# Patient Record
Sex: Female | Born: 1935 | Race: White | Hispanic: No | Smoking: Former smoker
Health system: Southern US, Community
[De-identification: ages and names within clinical notes are randomized; demographics above are authoritative.]

## PROBLEM LIST (undated history)

## (undated) DIAGNOSIS — F0390 Unspecified dementia without behavioral disturbance: Secondary | ICD-10-CM

## (undated) DIAGNOSIS — C801 Malignant (primary) neoplasm, unspecified: Secondary | ICD-10-CM

## (undated) DIAGNOSIS — J449 Chronic obstructive pulmonary disease, unspecified: Secondary | ICD-10-CM

## (undated) DIAGNOSIS — I4891 Unspecified atrial fibrillation: Secondary | ICD-10-CM

## (undated) DIAGNOSIS — C50919 Malignant neoplasm of unspecified site of unspecified female breast: Secondary | ICD-10-CM

## (undated) DIAGNOSIS — I509 Heart failure, unspecified: Secondary | ICD-10-CM

## (undated) DIAGNOSIS — I10 Essential (primary) hypertension: Secondary | ICD-10-CM

## (undated) HISTORY — PX: MASTECTOMY: SHX3

## (undated) HISTORY — PX: BACK SURGERY: SHX140

## (undated) HISTORY — PX: ABDOMINAL HYSTERECTOMY: SHX81

---

## 2005-05-06 ENCOUNTER — Ambulatory Visit: Payer: Self-pay | Admitting: Internal Medicine

## 2006-05-07 ENCOUNTER — Ambulatory Visit: Payer: Self-pay | Admitting: Internal Medicine

## 2007-03-29 ENCOUNTER — Emergency Department: Payer: Self-pay | Admitting: Emergency Medicine

## 2007-05-12 ENCOUNTER — Ambulatory Visit: Payer: Self-pay | Admitting: Internal Medicine

## 2007-05-18 ENCOUNTER — Ambulatory Visit: Payer: Self-pay | Admitting: Internal Medicine

## 2007-08-08 ENCOUNTER — Ambulatory Visit: Payer: Self-pay | Admitting: Internal Medicine

## 2007-08-09 ENCOUNTER — Emergency Department: Payer: Self-pay

## 2007-08-26 ENCOUNTER — Emergency Department: Payer: Self-pay | Admitting: Emergency Medicine

## 2008-05-16 ENCOUNTER — Ambulatory Visit: Payer: Self-pay | Admitting: Internal Medicine

## 2009-04-17 ENCOUNTER — Emergency Department: Payer: Self-pay | Admitting: Emergency Medicine

## 2009-05-17 ENCOUNTER — Ambulatory Visit: Payer: Self-pay | Admitting: Internal Medicine

## 2010-05-20 ENCOUNTER — Ambulatory Visit: Payer: Self-pay | Admitting: Internal Medicine

## 2011-02-25 ENCOUNTER — Ambulatory Visit: Payer: Self-pay | Admitting: Otolaryngology

## 2011-03-27 ENCOUNTER — Ambulatory Visit: Payer: Self-pay | Admitting: Specialist

## 2011-05-22 ENCOUNTER — Ambulatory Visit: Payer: Self-pay | Admitting: Internal Medicine

## 2011-07-29 ENCOUNTER — Ambulatory Visit: Payer: Self-pay | Admitting: Specialist

## 2012-03-11 ENCOUNTER — Ambulatory Visit: Payer: Self-pay | Admitting: Otolaryngology

## 2012-05-25 ENCOUNTER — Ambulatory Visit: Payer: Self-pay | Admitting: Internal Medicine

## 2012-06-12 ENCOUNTER — Emergency Department: Payer: Self-pay | Admitting: Internal Medicine

## 2012-06-12 LAB — COMPREHENSIVE METABOLIC PANEL
Albumin: 3.6 g/dL (ref 3.4–5.0)
Alkaline Phosphatase: 83 U/L (ref 50–136)
Bilirubin,Total: 0.7 mg/dL (ref 0.2–1.0)
Chloride: 104 mmol/L (ref 98–107)
Co2: 32 mmol/L (ref 21–32)
Creatinine: 0.99 mg/dL (ref 0.60–1.30)
EGFR (Non-African Amer.): 51 — ABNORMAL LOW
Glucose: 96 mg/dL (ref 65–99)
Osmolality: 281 (ref 275–301)
Sodium: 140 mmol/L (ref 136–145)

## 2012-06-12 LAB — CBC
HCT: 39.3 % (ref 35.0–47.0)
MCH: 29.8 pg (ref 26.0–34.0)
MCHC: 33 g/dL (ref 32.0–36.0)
MCV: 90 fL (ref 80–100)
Platelet: 171 10*3/uL (ref 150–440)
RBC: 4.36 10*6/uL (ref 3.80–5.20)
WBC: 3.8 10*3/uL (ref 3.6–11.0)

## 2013-02-07 ENCOUNTER — Ambulatory Visit: Payer: Self-pay | Admitting: Ophthalmology

## 2013-02-07 LAB — PROTIME-INR: Prothrombin Time: 37.4 secs — ABNORMAL HIGH (ref 11.5–14.7)

## 2013-02-07 LAB — POTASSIUM: Potassium: 4 mmol/L (ref 3.5–5.1)

## 2013-02-16 ENCOUNTER — Ambulatory Visit: Payer: Self-pay | Admitting: Ophthalmology

## 2013-07-07 ENCOUNTER — Ambulatory Visit: Payer: Self-pay | Admitting: Internal Medicine

## 2013-11-21 ENCOUNTER — Emergency Department: Payer: Self-pay | Admitting: Emergency Medicine

## 2013-11-21 LAB — PROTIME-INR: Prothrombin Time: 31.5 secs — ABNORMAL HIGH (ref 11.5–14.7)

## 2013-11-21 LAB — CBC WITH DIFFERENTIAL/PLATELET
Basophil %: 0.9 %
Eosinophil #: 0.2 10*3/uL (ref 0.0–0.7)
HCT: 37.1 % (ref 35.0–47.0)
HGB: 12.5 g/dL (ref 12.0–16.0)
Lymphocyte #: 1.1 10*3/uL (ref 1.0–3.6)
Lymphocyte %: 20.7 %
MCH: 30.1 pg (ref 26.0–34.0)
MCHC: 33.6 g/dL (ref 32.0–36.0)
Monocyte #: 0.5 x10 3/mm (ref 0.2–0.9)
Monocyte %: 10 %
Neutrophil %: 65.2 %
WBC: 5.2 10*3/uL (ref 3.6–11.0)

## 2014-07-10 ENCOUNTER — Ambulatory Visit: Payer: Self-pay | Admitting: Internal Medicine

## 2015-03-16 NOTE — Op Note (Signed)
PATIENT NAME:  Haley Paul, Haley Paul MR#:  [id number] DATE OF BIRTH:  [birth year]  DATE OF SURGERY:  02/16/2013  PROCEDURES PERFORMED: 1.  Pars plana vitrectomy of the left eye.  2.  Internal limiting membrane peel of the left eye.   PREOPERATIVE DIAGNOSES: 1.  Epiretinal membrane of the left eye.  2.  Retinal edema of the left eye.   POSTOPERATIVE DIAGNOSES: 1.  Epiretinal membrane of the left eye.  2.  Retinal edema of the left eye.   ESTIMATED BLOOD LOSS: Less than 1 mL.   PRIMARY SURGEON: Garlan Fair, M.D.   ANESTHESIA: Retrobulbar block of the left eye, with monitored anesthesia care.   COMPLICATIONS: None.   INDICATIONS FOR PROCEDURE: This patient presented to my office with slowly decreasing vision in the left eye. Examination revealed a visual acuity of 20/50-, associated with an epiretinal membrane and retinal edema. Risks, benefits, and alternatives of the above procedure were discussed and the patient wished to proceed.   DETAILS: After informed consent was obtained the patient was brought in the operative suite at [facility]. The patient was placed in supine position and was given a small dose of Alfenta, and a retrobulbar block was performed in the left eye by the primary surgeon without any complications. The left eye was prepped and draped in sterile manner. After a lid speculum was inserted a 25-gauge trocar was placed inferotemporally through displaced conjunctiva in an oblique fashion 3 mm beyond the limbus. The infusion cannula was turned on and inserted through the trocar and secured into position with Steri-Strips. Two more trocars were placed in a similar fashion superotemporally and superonasally. The vitreous cutter and light pipe were introduced in the eye and a core vitrectomy was performed. Vitreous face was confirmed as already elevated, and the vitreous face was removed. Peripheral vitreous was trimmed for 360 degrees. Indocyanine green  was injected onto the posterior pole and was removed within 30 seconds. A Tano brush was utilized to create an internal limiting membrane flap inferior to the fovea and near the arcade. Intraocular forceps were used to peel the internal limiting membrane along with the epiretinal membrane, along with the epiretinal membrane, for 360 degrees around the fovea for a total diameter of 2 disk-diameters. No signs of any macular hole could be identified. A scleral depressed exam was performed for 360 degrees. No signs of any breaks, tears, or retinal detachment could be identified. A partial air-fluid exchange was performed. The trocars were removed and the wounds were noted to be airtight. Pressure in the eye was confirmed to be approximately 15 mmHg. Five milligrams of dexamethasone was given into the inferior fornix and the lid speculum was removed. The eye was cleaned and TobraDex was placed in the eye. A patch and shield were placed over the eye and the patient was taken to postanesthesia care with instructions to remain head-up.     ____________________________ Teresa Pelton. Starling Manns, MD mfa:[id number] D: 02/16/2013 07:59:00 ET T: 02/16/2013 08:09:29 ET JOB#: [id number]  cc: Teresa Pelton. Starling Manns, MD, <Dictator> Coralee Rud MD ELECTRONICALLY SIGNED 02/23/2013 9:00

## 2016-02-14 ENCOUNTER — Encounter: Payer: Self-pay | Admitting: Intensive Care

## 2016-02-14 ENCOUNTER — Emergency Department
Admission: EM | Admit: 2016-02-14 | Discharge: 2016-02-14 | Disposition: A | Discharge status: Home / Self Care | Payer: Medicare Other | Attending: Emergency Medicine | Admitting: Emergency Medicine

## 2016-02-14 ENCOUNTER — Emergency Department: Payer: Medicare Other

## 2016-02-14 DIAGNOSIS — I10 Essential (primary) hypertension: Secondary | ICD-10-CM | POA: Insufficient documentation

## 2016-02-14 DIAGNOSIS — J159 Unspecified bacterial pneumonia: Secondary | ICD-10-CM | POA: Insufficient documentation

## 2016-02-14 DIAGNOSIS — Z79899 Other long term (current) drug therapy: Secondary | ICD-10-CM | POA: Diagnosis not present

## 2016-02-14 DIAGNOSIS — F0390 Unspecified dementia without behavioral disturbance: Secondary | ICD-10-CM | POA: Diagnosis not present

## 2016-02-14 DIAGNOSIS — J189 Pneumonia, unspecified organism: Secondary | ICD-10-CM

## 2016-02-14 DIAGNOSIS — J441 Chronic obstructive pulmonary disease with (acute) exacerbation: Secondary | ICD-10-CM | POA: Insufficient documentation

## 2016-02-14 DIAGNOSIS — R0602 Shortness of breath: Secondary | ICD-10-CM | POA: Diagnosis present

## 2016-02-14 DIAGNOSIS — J90 Pleural effusion, not elsewhere classified: Secondary | ICD-10-CM | POA: Diagnosis not present

## 2016-02-14 DIAGNOSIS — Z7901 Long term (current) use of anticoagulants: Secondary | ICD-10-CM | POA: Diagnosis not present

## 2016-02-14 DIAGNOSIS — R0902 Hypoxemia: Secondary | ICD-10-CM | POA: Diagnosis not present

## 2016-02-14 HISTORY — DX: Malignant (primary) neoplasm, unspecified: C80.1

## 2016-02-14 HISTORY — DX: Unspecified dementia, unspecified severity, without behavioral disturbance, psychotic disturbance, mood disturbance, and anxiety: F03.90

## 2016-02-14 HISTORY — DX: Malignant neoplasm of unspecified site of unspecified female breast: C50.919

## 2016-02-14 HISTORY — DX: Chronic obstructive pulmonary disease, unspecified: J44.9

## 2016-02-14 HISTORY — DX: Heart failure, unspecified: I50.9

## 2016-02-14 HISTORY — DX: Unspecified atrial fibrillation: I48.91

## 2016-02-14 HISTORY — DX: Essential (primary) hypertension: I10

## 2016-02-14 LAB — COMPREHENSIVE METABOLIC PANEL
ALT: 20 U/L (ref 14–54)
AST: 28 U/L (ref 15–41)
Albumin: 3.4 g/dL — ABNORMAL LOW (ref 3.5–5.0)
Alkaline Phosphatase: 163 U/L — ABNORMAL HIGH (ref 38–126)
Anion gap: 5 (ref 5–15)
BUN: 25 mg/dL — ABNORMAL HIGH (ref 6–20)
CALCIUM: 10 mg/dL (ref 8.9–10.3)
CHLORIDE: 103 mmol/L (ref 101–111)
CO2: 29 mmol/L (ref 22–32)
CREATININE: 0.94 mg/dL (ref 0.44–1.00)
GFR calc non Af Amer: 51 mL/min — ABNORMAL LOW (ref >60)
GFR, EST AFRICAN AMERICAN: 59 mL/min — AB (ref >60)
Glucose, Bld: 113 mg/dL — ABNORMAL HIGH (ref 65–99)
POTASSIUM: 3.9 mmol/L (ref 3.5–5.1)
SODIUM: 137 mmol/L (ref 135–145)
TOTAL PROTEIN: 8.8 g/dL — AB (ref 6.5–8.1)
Total Bilirubin: 1.2 mg/dL (ref 0.3–1.2)

## 2016-02-14 LAB — URINALYSIS COMPLETE WITH MICROSCOPIC (ARMC ONLY)
Bacteria, UA: NONE SEEN
Bilirubin Urine: NEGATIVE
GLUCOSE, UA: NEGATIVE mg/dL
Hgb urine dipstick: NEGATIVE
Ketones, ur: NEGATIVE mg/dL
Leukocytes, UA: NEGATIVE
Nitrite: NEGATIVE
Protein, ur: NEGATIVE mg/dL
Specific Gravity, Urine: 1.012 (ref 1.005–1.030)
pH: 6 (ref 5.0–8.0)

## 2016-02-14 LAB — BLOOD GAS, ARTERIAL
ALLENS TEST (PASS/FAIL): POSITIVE — AB
Acid-Base Excess: 4 mmol/L — ABNORMAL HIGH (ref 0.0–3.0)
BICARBONATE: 29.2 meq/L — AB (ref 21.0–28.0)
FIO2: 21
O2 Saturation: 89.8 %
PATIENT TEMPERATURE: 37
pCO2 arterial: 45 mmHg (ref 32.0–48.0)
pH, Arterial: 7.42 (ref 7.350–7.450)
pO2, Arterial: 57 mmHg — ABNORMAL LOW (ref 83.0–108.0)

## 2016-02-14 LAB — TROPONIN I

## 2016-02-14 LAB — CBC
HCT: 33.2 % — ABNORMAL LOW (ref 35.0–47.0)
Hemoglobin: 10.8 g/dL — ABNORMAL LOW (ref 12.0–16.0)
MCH: 27 pg (ref 26.0–34.0)
MCHC: 32.6 g/dL (ref 32.0–36.0)
MCV: 82.8 fL (ref 80.0–100.0)
PLATELETS: 91 10*3/uL — AB (ref 150–440)
RBC: 4.01 MIL/uL (ref 3.80–5.20)
RDW: 17.1 % — AB (ref 11.5–14.5)
WBC: 8.8 10*3/uL (ref 3.6–11.0)

## 2016-02-14 LAB — PROTIME-INR
INR: 4.64 — AB
PROTHROMBIN TIME: 42.5 s — AB (ref 11.4–15.0)

## 2016-02-14 LAB — BRAIN NATRIURETIC PEPTIDE: B NATRIURETIC PEPTIDE 5: 881 pg/mL — AB (ref 0.0–100.0)

## 2016-02-14 MED ORDER — DEXTROSE 5 % IV SOLN
1.0000 g | Freq: Once | INTRAVENOUS | Status: AC
  Administered 2016-02-14: 1 g via INTRAVENOUS
  Filled 2016-02-14: qty 10

## 2016-02-14 MED ORDER — DEXTROSE 5 % IV SOLN
500.0000 mg | Freq: Once | INTRAVENOUS | Status: AC
  Administered 2016-02-14: 500 mg via INTRAVENOUS
  Filled 2016-02-14: qty 500

## 2016-02-14 MED ORDER — AMOXICILLIN-POT CLAVULANATE 875-125 MG PO TABS: 1.0000 | ORAL_TABLET | Freq: Two times a day (BID) | ORAL | Status: DC

## 2016-02-14 MED ORDER — FUROSEMIDE 20 MG PO TABS: 20.0000 mg | ORAL_TABLET | Freq: Every day | ORAL | Status: DC

## 2016-02-14 NOTE — Consult Note (Signed)
[facility] [place] NAME: Haley Paul    MR#:  [id number]  DATE OF BIRTH:  1920/11/22  DATE OF ADMISSION:  02/14/2016  PRIMARY CARE PHYSICIAN: Lisette Grinder MD  REQUESTING/REFERRING PHYSICIAN: Marta Antu MD  CHIEF COMPLAINT:   Chief Complaint  Patient presents with  . Shortness of Breath    HISTORY OF PRESENT ILLNESS:  Haley Paul  is a 82 y.o. female with a known history of COPD, breast cancer, CHF and atrial fibrillation is seen in consultation for shortness of breath. Patient was given a breathing treatment on the way over in the and per family, was much improved on arrival to the ER. The daughter stated that she's been having cough and cold symptoms for the past couple of days. Because of her dementia and cannot give [place] any other significant history. Patient also has difficulty hearing.  She is saturating 95-96% on room air, and her vitals are very stable.  She does not look toxic.  Her white count is normal. PAST MEDICAL HISTORY:   Past Medical History  Diagnosis Date  . COPD (chronic obstructive pulmonary disease) ([place])   . Cancer ([place])   . Breast cancer ([place])   . CHF (congestive heart failure) ([place])   . Atrial fibrillation ([place])   . Hypertension   . Dementia    PAST SURGICAL HISTOIRY:   Past Surgical History  Procedure Laterality Date  . Abdominal hysterectomy    . Back surgery    . Mastectomy Left     SOCIAL HISTORY:   Social History  Substance Use Topics  . Smoking status: Former Research scientist (life sciences)  . Smokeless tobacco: Never Used  . Alcohol Use: No    FAMILY HISTORY:  History reviewed. No pertinent family history. Father died of cancer in 15s DRUG ALLERGIES:  No Known Allergies  REVIEW OF SYSTEMS:  CONSTITUTIONAL: No fever, fatigue or weakness.  EYES: No blurred or double vision.  EARS, NOSE, AND THROAT: No tinnitus or ear pain.  RESPIRATORY: + for cough, shortness of breath, No wheezing or  hemoptysis.  CARDIOVASCULAR: No chest pain, orthopnea, edema.  GASTROINTESTINAL: No nausea, vomiting, diarrhea or abdominal pain.  GENITOURINARY: No dysuria, hematuria.  ENDOCRINE: No polyuria, nocturia,  HEMATOLOGY: No anemia, easy bruising or bleeding SKIN: No rash or lesion. MUSCULOSKELETAL: No joint pain or arthritis. + LE edema   NEUROLOGIC: No tingling, numbness, weakness.  PSYCHIATRY: No anxiety or depression.   MEDICATIONS AT HOME:   Prior to Admission medications   Medication Sig Start Date End Date Taking? Authorizing Provider  Calcium Carbonate-Vitamin D 600-400 MG-UNIT tablet Take 1 tablet by mouth 2 (two) times daily. 05/22/14  Yes Historical Provider, MD  Cyanocobalamin (RA VITAMIN B-12 TR) 1000 MCG TBCR Take 1 tablet by mouth daily.   Yes Historical Provider, MD  diltiazem (DILACOR XR) 120 MG 24 hr capsule Take 1 capsule by mouth at bedtime.  07/31/15  Yes Historical Provider, MD  docusate sodium (COLACE) 100 MG capsule Take 100 mg by mouth 2 (two) times daily.   Yes Historical Provider, MD  levothyroxine (SYNTHROID, LEVOTHROID) 50 MCG tablet Take 1 tablet by mouth at bedtime.  07/31/15  Yes Historical Provider, MD  metoprolol tartrate (LOPRESSOR) 25 MG tablet Take 1 tablet by mouth 2 (two) times daily. 07/31/15  Yes Historical Provider, MD  risperiDONE (RISPERDAL) 0.5 MG tablet Take 1 tablet by mouth 2 (two) times daily.  01/14/16 07/12/16 Yes Historical Provider, MD  simvastatin (ZOCOR) 40  MG tablet Take 1 tablet by mouth at bedtime.  07/31/15  Yes Historical Provider, MD  warfarin (COUMADIN) 2 MG tablet Take 1 tablet by mouth at bedtime. 01/22/16 01/21/17 Yes Historical Provider, MD  amoxicillin-clavulanate (AUGMENTIN) 875-125 MG tablet Take 1 tablet by mouth 2 (two) times daily. 02/14/16   Max Sane, MD  furosemide (LASIX) 20 MG tablet Take 1 tablet (20 mg total) by mouth daily. 20 mg po bid for next 3 days and then start 20 mg daily 02/14/16   Max Sane, MD      VITAL SIGNS:   Blood pressure 133/84, pulse 35, temperature 98.2 F (36.8 C), temperature source Oral, resp. rate 17, weight 63.504 kg (140 lb), SpO2 94 %. PHYSICAL EXAMINATION:  GENERAL:  82 y.o.-year-old patient lying in the bed with no acute distress.  EYES: Pupils equal, round, reactive to light and accommodation. No scleral icterus. Extraocular muscles intact.  HEENT: Head atraumatic, normocephalic. Oropharynx and nasopharynx clear.  NECK:  Supple, no jugular venous distention. No thyroid enlargement, no tenderness.  LUNGS: Normal breath sounds bilaterally, no wheezing, rales,rhonchi or crepitation. No use of accessory muscles of respiration.  CARDIOVASCULAR: S1, S2 normal. No murmurs, rubs, or gallops.  ABDOMEN: Soft, nontender, nondistended. Bowel sounds present. No organomegaly or mass.  EXTREMITIES: 1 + pedal edema, No cyanosis, or clubbing.  NEUROLOGIC: Cranial nerves II through XII are intact. Muscle strength 5/5 in all extremities. Sensation intact. Gait not checked.  PSYCHIATRIC: The patient is alert and oriented x 3.  Has significant difficulty hearing SKIN: No obvious rash, lesion, or ulcer.  LABORATORY PANEL:   CBC  Recent Labs Lab 02/14/16 1406  WBC 8.8  HGB 10.8*  HCT 33.2*  PLT 91*   ------------------------------------------------------------------------------------------------------------------  Chemistries   Recent Labs Lab 02/14/16 1406  NA 137  K 3.9  CL 103  CO2 29  GLUCOSE 113*  BUN 25*  CREATININE 0.94  CALCIUM 10.0  AST 28  ALT 20  ALKPHOS 163*  BILITOT 1.2   ------------------------------------------------------------------------------------------------------------------  Cardiac Enzymes  Recent Labs Lab 02/14/16 1406  TROPONINI <0.03   ------------------------------------------------------------------------------------------------------------------  RADIOLOGY:  Dg Chest 2 View  02/14/2016  CLINICAL DATA:  Altered mental status EXAM: CHEST  2  VIEW COMPARISON:  06/12/2012 FINDINGS: New dense right upper lobe consolidation. There is some airspace disease at the posterior right lung base heart is enlarged. Left upper lung zone is clear. Left pleural effusion is suspected. No pneumothorax. IMPRESSION: Right upper and lower lobe consolidation. Left pleural effusion. Followup PA and lateral chest X-ray is recommended in 3-4 weeks following trial of antibiotic therapy to ensure resolution and exclude underlying malignancy. Electronically Signed   By: Marybelle Killings M.D.   On: 02/14/2016 14:29    EKG:   Orders placed or performed during the hospital encounter of 02/14/16  . ED EKG  . ED EKG  . EKG 12-Lead  . EKG 12-Lead  . ED EKG  . ED EKG    IMPRESSION AND PLAN:  82 year old female seen in the emergency department in consultation for shortness of breath  * Pneumonia: Community-acquired, patient does not have any fever, no leukocytosis.  She does not look toxic.  I will recommend treating her as an outpatient with oral Augmentin for 5 days.  A chest x-ray and monitor her clinically.  * Lower extremity edema: With minimal left-sided pleural effusion, worrisome for worsening of her CHF, recommend increasing her Lasix to twice a day from once a day that she normally takes for  3 more days and repeat basic metabolic panel on coming Monday for further monitoring and adjustment in dosing  * Atrial fibrillation: She is on Coumadin.  Her INR is 4.6.  I recommend holding her Coumadin over the weekend and resume it on Monday after her pro time checked  * Hypothyroidism.  Continue Synthroid  Recommend follow-up with Dr. Lisette Grinder early next week  All the records are reviewed and case discussed with Consulting provider. Management plans discussed with the patient, family and they are in agreement.  CODE STATUS: DO NOT RESUSCITATE  TOTAL TIME TAKING CARE OF THIS PATIENT: 45 minutes.    [facility], Aliesha Dolata M.D on 02/14/2016 at 5:35 PM  Between 7am  to 6pm - Pager - [phone]  After 6pm go to [email or web address] - password [email or web address] [facility]  Office  [phone]  CC: Primary care Physician: Lisette Grinder MD  Note: This dictation was prepared with Dragon dictation along with smaller phrase technology. Any transcriptional errors that result from this process are unintentional.

## 2016-02-14 NOTE — ED Notes (Signed)
Patient arrived by EMS from home. Family called EMS for increased difficulty breathing the past two days. Patient has HX of COPD, CHF, A-fib. EMS states patient was Room Air 93-94. EMS states crackles/ Rhonchi heard in lungs. Patient also has bilateral swelling in lower extremities and family states she is on lasix.

## 2016-02-14 NOTE — ED Provider Notes (Addendum)
[facility] Emergency Department Provider Note  ____________________________________________  Time seen: Approximately 2:09 PM  I have reviewed the triage vital signs and the nursing notes.   HISTORY  Chief Complaint Shortness of Breath    HPI Haley Paul is a 82 y.o. female who has severe dementia, and family states that she had progressive worsening shortness of breath this morning. He stated when they called her she was in pretty significant distress from her breathing. Patient has had history of COPD and CHF, and patient was given a breathing treatment on the way over in the [place] and per family, was much improved on arrival to the ER. Patient was still having some mild shortness of breath but not nearly as bad. The daughter stated that she's been having cough and cold symptoms for the past couple of days. Patient because of her dementia and cannot give [place] any other significant history. Patient does not appear to be in any pain.   Past Medical History  Diagnosis Date  . COPD (chronic obstructive pulmonary disease) ([place])   . Cancer ([place])   . Breast cancer ([place])   . CHF (congestive heart failure) ([place])   . Atrial fibrillation ([place])   . Hypertension   . Dementia     There are no active problems to display for this patient.   Past Surgical History  Procedure Laterality Date  . Abdominal hysterectomy    . Back surgery    . Mastectomy Left     Current Outpatient Rx  Name  Route  Sig  Dispense  Refill  . Calcium Carbonate-Vitamin D 600-400 MG-UNIT tablet   Oral   Take 1 tablet by mouth 2 (two) times daily.         . Cyanocobalamin (RA VITAMIN B-12 TR) 1000 MCG TBCR   Oral   Take 1 tablet by mouth daily.         Marland Kitchen diltiazem (DILACOR XR) 120 MG 24 hr capsule   Oral   Take 1 capsule by mouth at bedtime.          . docusate sodium (COLACE) 100 MG capsule   Oral   Take 100 mg by mouth 2 (two) times daily.         .  furosemide (LASIX) 20 MG tablet   Oral   Take 1 tablet by mouth daily.         Marland Kitchen levothyroxine (SYNTHROID, LEVOTHROID) 50 MCG tablet   Oral   Take 1 tablet by mouth at bedtime.          . metoprolol tartrate (LOPRESSOR) 25 MG tablet   Oral   Take 1 tablet by mouth 2 (two) times daily.         . risperiDONE (RISPERDAL) 0.5 MG tablet   Oral   Take 1 tablet by mouth 2 (two) times daily.          . simvastatin (ZOCOR) 40 MG tablet   Oral   Take 1 tablet by mouth at bedtime.          Marland Kitchen warfarin (COUMADIN) 2 MG tablet   Oral   Take 1 tablet by mouth at bedtime.           Allergies Review of patient's allergies indicates no known allergies.  History reviewed. No pertinent family history.  Social History Social History  Substance Use Topics  . Smoking status: Former Research scientist (life sciences)  . Smokeless tobacco: Never Used  . Alcohol Use: No  Review of Systems Patient is unable to give any review of systems secondary to her severe dementia. Family had given the history of patient and respiratory distress this morning.  10-point ROS otherwise negative.  ____________________________________________   PHYSICAL EXAM:  VITAL SIGNS: ED Triage Vitals  Enc Vitals Group     BP 02/14/16 1339 142/72 mmHg     Pulse Rate 02/14/16 1339 82     Resp 02/14/16 1339 27     Temp 02/14/16 1339 98.2 F (36.8 C)     Temp Source 02/14/16 1339 Oral     SpO2 02/14/16 1339 93 %     Weight 02/14/16 1339 140 lb (63.504 kg)     Height --      Head Cir --      Peak Flow --      Pain Score --      Pain Loc --      Pain Edu? --      Excl. in [place]? --     Constitutional: Alert and oriented. Well appearing and in mild distress secondary to her SOB Eyes: Conjunctivae are normal. PERRL. EOMI. Head: Atraumatic. Nose: No congestion/rhinnorhea. Mouth/Throat: Mucous membranes are moist.  Oropharynx non-erythematous. Neck: No stridor.   Cardiovascular: Normal rate, regular rhythm. Grossly normal  heart sounds.  Good peripheral circulation. Respiratory: Normal respiratory effort.  No retractions. Lungs with mild tachypnea and minimal wheezing throughout. Gastrointestinal: Soft and nontender. No distention. No abdominal bruits. No CVA tenderness. Musculoskeletal: No lower extremity tenderness nor edema.  No joint effusions. Neurologic:  Patient demented. No gross focal neurologic deficits are appreciated. No gait instability. Skin:  Skin is warm, dry and intact. No rash noted. Psychiatric: Mood and affect are normal. Speech and behavior are normal.  ____________________________________________   LABS (all labs ordered are listed, but only abnormal results are displayed)  Labs Reviewed  CBC - Abnormal; Notable for the following:    Hemoglobin 10.8 (*)    HCT 33.2 (*)    RDW 17.1 (*)    Platelets 91 (*)    All other components within normal limits  COMPREHENSIVE METABOLIC PANEL - Abnormal; Notable for the following:    Glucose, Bld 113 (*)    BUN 25 (*)    Total Protein 8.8 (*)    Albumin 3.4 (*)    Alkaline Phosphatase 163 (*)    GFR calc non Af Amer 51 (*)    GFR calc Af Amer 59 (*)    All other components within normal limits  URINALYSIS COMPLETEWITH MICROSCOPIC ([facility] ONLY) - Abnormal; Notable for the following:    Color, Urine YELLOW (*)    APPearance CLEAR (*)    Squamous Epithelial / LPF 0-5 (*)    All other components within normal limits  BRAIN NATRIURETIC PEPTIDE - Abnormal; Notable for the following:    B Natriuretic Peptide 881.0 (*)    All other components within normal limits  BLOOD GAS, ARTERIAL - Abnormal; Notable for the following:    pO2, Arterial 57 (*)    Bicarbonate 29.2 (*)    Acid-Base Excess 4.0 (*)    Allens test (pass/fail) POSITIVE (*)    All other components within normal limits  CULTURE, BLOOD (ROUTINE X 2)  CULTURE, BLOOD (ROUTINE X 2)  TROPONIN I  PROTIME-INR   ____________________________________________  EKG ED ECG REPORT I,  Ruby Cola, the attending physician, personally viewed and interpreted this ECG.   Date: 02/14/2016  EKG Time: 1422  Rate: 84  Rhythm: normal EKG, normal sinus rhythm, unchanged from previous tracings, normal sinus rhythm, nonspecific ST and T waves changes, poor R-wave progression, nonspecific interventricular conduction deficit  Axis: Left axis deviation  Intervals:nonspecific intraventricular conduction delay  ST&T Change: Nonspecific ST and T-wave changes    ____________________________________________  RADIOLOGY Dg Chest 2 View  02/14/2016  CLINICAL DATA:  Altered mental status EXAM: CHEST  2 VIEW COMPARISON:  06/12/2012 FINDINGS: New dense right upper lobe consolidation. There is some airspace disease at the posterior right lung base heart is enlarged. Left upper lung zone is clear. Left pleural effusion is suspected. No pneumothorax. IMPRESSION: Right upper and lower lobe consolidation. Left pleural effusion. Followup PA and lateral chest X-ray is recommended in 3-4 weeks following trial of antibiotic therapy to ensure resolution and exclude underlying malignancy. Electronically Signed   By: Marybelle Killings M.D.   On: 02/14/2016 14:29    ____________________________________________   PROCEDURES  Procedure(s) performed: None  Critical Care performed: No  ____________________________________________   INITIAL IMPRESSION / ASSESSMENT AND PLAN / ED COURSE  Pertinent labs & imaging results that were available during my care of the patient were reviewed by me and considered in my medical decision making (see chart for details) 3:45 PM Patient is much improved after her breathing treatment. Patient will get routine chest x-ray, BMP, labs, and EKG.   3:45 PM Patient with right upper lobe and right lower lobe pneumonia as well as she's got a left pleural effusion. Patient was started on IV Rocephin and Zithromax as well as she was placed on 3 L O2 by nasal cannula secondary to  hypoxemia. Hospitalist was consulted for admission. ____________________________________________   FINAL CLINICAL IMPRESSION(S) / ED DIAGNOSES  Final diagnoses:  Shortness of breath  Community acquired pneumonia  Pleural effusion, left  Hypoxemia      Ruby Cola, MD 02/14/16 [place], MD 02/14/16 [phone]

## 2016-02-14 NOTE — Discharge Instructions (Signed)

## 2016-02-19 LAB — CULTURE, BLOOD (ROUTINE X 2)
CULTURE: NO GROWTH
Culture: NO GROWTH

## 2016-04-22 ENCOUNTER — Inpatient Hospital Stay
Admission: EM | Admit: 2016-04-22 | Discharge: 2016-04-25 | DRG: 379 | Disposition: A | Discharge status: Skilled Nursing Facility | Payer: Medicare Other | Attending: Internal Medicine | Admitting: Internal Medicine

## 2016-04-22 DIAGNOSIS — I4891 Unspecified atrial fibrillation: Secondary | ICD-10-CM

## 2016-04-22 DIAGNOSIS — Z66 Do not resuscitate: Secondary | ICD-10-CM | POA: Diagnosis present

## 2016-04-22 DIAGNOSIS — I110 Hypertensive heart disease with heart failure: Secondary | ICD-10-CM | POA: Diagnosis present

## 2016-04-22 DIAGNOSIS — Z9010 Acquired absence of unspecified breast and nipple: Secondary | ICD-10-CM

## 2016-04-22 DIAGNOSIS — K625 Hemorrhage of anus and rectum: Secondary | ICD-10-CM | POA: Diagnosis present

## 2016-04-22 DIAGNOSIS — D500 Iron deficiency anemia secondary to blood loss (chronic): Secondary | ICD-10-CM | POA: Diagnosis present

## 2016-04-22 DIAGNOSIS — Z90710 Acquired absence of both cervix and uterus: Secondary | ICD-10-CM

## 2016-04-22 DIAGNOSIS — Z9889 Other specified postprocedural states: Secondary | ICD-10-CM

## 2016-04-22 DIAGNOSIS — Z882 Allergy status to sulfonamides status: Secondary | ICD-10-CM

## 2016-04-22 DIAGNOSIS — F0390 Unspecified dementia without behavioral disturbance: Secondary | ICD-10-CM

## 2016-04-22 DIAGNOSIS — C50919 Malignant neoplasm of unspecified site of unspecified female breast: Secondary | ICD-10-CM

## 2016-04-22 DIAGNOSIS — Z8249 Family history of ischemic heart disease and other diseases of the circulatory system: Secondary | ICD-10-CM

## 2016-04-22 DIAGNOSIS — Z888 Allergy status to other drugs, medicaments and biological substances status: Secondary | ICD-10-CM

## 2016-04-22 DIAGNOSIS — I10 Essential (primary) hypertension: Secondary | ICD-10-CM

## 2016-04-22 DIAGNOSIS — Z7982 Long term (current) use of aspirin: Secondary | ICD-10-CM

## 2016-04-22 DIAGNOSIS — K922 Gastrointestinal hemorrhage, unspecified: Principal | ICD-10-CM | POA: Diagnosis present

## 2016-04-22 DIAGNOSIS — Z79899 Other long term (current) drug therapy: Secondary | ICD-10-CM

## 2016-04-22 DIAGNOSIS — Z853 Personal history of malignant neoplasm of breast: Secondary | ICD-10-CM

## 2016-04-22 DIAGNOSIS — I509 Heart failure, unspecified: Secondary | ICD-10-CM | POA: Diagnosis present

## 2016-04-22 DIAGNOSIS — Z87891 Personal history of nicotine dependence: Secondary | ICD-10-CM

## 2016-04-22 DIAGNOSIS — D649 Anemia, unspecified: Secondary | ICD-10-CM | POA: Diagnosis present

## 2016-04-22 DIAGNOSIS — J449 Chronic obstructive pulmonary disease, unspecified: Secondary | ICD-10-CM

## 2016-04-22 LAB — PREPARE RBC (CROSSMATCH)

## 2016-04-22 LAB — CBC WITH DIFFERENTIAL/PLATELET
BASOS ABS: 0 10*3/uL (ref 0–0.1)
BASOS PCT: 1 %
EOS PCT: 2 %
Eosinophils Absolute: 0.1 10*3/uL (ref 0–0.7)
HEMATOCRIT: 29.3 % — AB (ref 35.0–47.0)
Hemoglobin: 9.4 g/dL — ABNORMAL LOW (ref 12.0–16.0)
Lymphocytes Relative: 21 %
Lymphs Abs: 0.9 10*3/uL — ABNORMAL LOW (ref 1.0–3.6)
MCH: 26.7 pg (ref 26.0–34.0)
MCHC: 32.2 g/dL (ref 32.0–36.0)
MCV: 82.9 fL (ref 80.0–100.0)
MONO ABS: 0.4 10*3/uL (ref 0.2–0.9)
MONOS PCT: 11 %
NEUTROS ABS: 2.7 10*3/uL (ref 1.4–6.5)
Neutrophils Relative %: 65 %
PLATELETS: 152 10*3/uL (ref 150–440)
RBC: 3.53 MIL/uL — ABNORMAL LOW (ref 3.80–5.20)
RDW: 18.2 % — AB (ref 11.5–14.5)
WBC: 4.1 10*3/uL (ref 3.6–11.0)

## 2016-04-22 LAB — COMPREHENSIVE METABOLIC PANEL
ALK PHOS: 144 U/L — AB (ref 38–126)
ALT: 14 U/L (ref 14–54)
AST: 28 U/L (ref 15–41)
Albumin: 3.2 g/dL — ABNORMAL LOW (ref 3.5–5.0)
Anion gap: 4 — ABNORMAL LOW (ref 5–15)
BILIRUBIN TOTAL: 0.6 mg/dL (ref 0.3–1.2)
BUN: 28 mg/dL — AB (ref 6–20)
CALCIUM: 9.5 mg/dL (ref 8.9–10.3)
CO2: 29 mmol/L (ref 22–32)
CREATININE: 1.15 mg/dL — AB (ref 0.44–1.00)
Chloride: 104 mmol/L (ref 101–111)
GFR, EST AFRICAN AMERICAN: 46 mL/min — AB (ref >60)
GFR, EST NON AFRICAN AMERICAN: 40 mL/min — AB (ref >60)
Glucose, Bld: 124 mg/dL — ABNORMAL HIGH (ref 65–99)
Potassium: 4.9 mmol/L (ref 3.5–5.1)
Sodium: 137 mmol/L (ref 135–145)
TOTAL PROTEIN: 8.1 g/dL (ref 6.5–8.1)

## 2016-04-22 LAB — LACTIC ACID, PLASMA: LACTIC ACID, VENOUS: 0.9 mmol/L (ref 0.5–2.0)

## 2016-04-22 LAB — TROPONIN I

## 2016-04-22 LAB — ABO/RH: ABO/RH(D): O NEG

## 2016-04-22 MED ORDER — PANTOPRAZOLE SODIUM 40 MG IV SOLR
40.0000 mg | Freq: Once | INTRAVENOUS | Status: AC
  Administered 2016-04-22: 40 mg via INTRAVENOUS

## 2016-04-22 MED ORDER — SODIUM CHLORIDE 0.9 % IV SOLN
80.0000 mg | Freq: Once | INTRAVENOUS | Status: AC
  Administered 2016-04-23: 80 mg via INTRAVENOUS
  Filled 2016-04-22: qty 80

## 2016-04-22 MED ORDER — SODIUM CHLORIDE 0.9 % IV SOLN
10.0000 mL/h | Freq: Once | INTRAVENOUS | Status: AC
  Administered 2016-04-22: 10 mL/h via INTRAVENOUS

## 2016-04-22 MED ORDER — SODIUM CHLORIDE 0.9 % IV SOLN
10.0000 mL/h | Freq: Once | INTRAVENOUS | Status: AC
  Administered 2016-04-23: 10 mL/h via INTRAVENOUS

## 2016-04-22 NOTE — ED Notes (Signed)
Bair hugger applied to patient, turned on high. Family at bedside.

## 2016-04-22 NOTE — ED Notes (Signed)
Haley Paul is HCPOA, son.

## 2016-04-22 NOTE — ED Provider Notes (Signed)
[facility] Emergency Department Provider Note   ____________________________________________  Time seen: Approximately 8:30 PM I have reviewed the triage vital signs and the triage nursing note.  HISTORY  Chief Complaint Rectal Bleeding   Historian Limited from patient as she has dementia and critical illness acute GI bleeding. Patient's son and daughter-in-law who are with her today  HPI Haley Paul is a 82 y.o. female who lives at home with family members around, and they noticed bloody clots in her diaper about 40 minutes prior to arrival. She takes aspirin daily, but no other blood thinner.  No history of GI bleeding. No abdominal pain. No vomiting blood. No coughing or trouble breathing.  Patient does have underlying dementia.  Family members state that the patient is DO NOT RESUSCITATE and also has a history of bradycardia down into the 43s and 30s for which they have elected not to pursue any additional invasive treatment options such as pacemaker.    Past Medical History  Diagnosis Date  . COPD (chronic obstructive pulmonary disease) ([place])   . Cancer ([place])   . Breast cancer ([place])   . CHF (congestive heart failure) ([place])   . Atrial fibrillation ([place])   . Hypertension   . Dementia     There are no active problems to display for this patient.   Past Surgical History  Procedure Laterality Date  . Abdominal hysterectomy    . Back surgery    . Mastectomy Left     Current Outpatient Rx  Name  Route  Sig  Dispense  Refill  . aspirin EC 81 MG tablet   Oral   Take 1 tablet by mouth daily.         Marland Kitchen diltiazem (DILACOR XR) 120 MG 24 hr capsule   Oral   Take 1 capsule by mouth at bedtime.          . docusate sodium (COLACE) 100 MG capsule   Oral   Take 100 mg by mouth 2 (two) times daily.         . furosemide (LASIX) 20 MG tablet   Oral   Take 20 mg by mouth daily.         Marland Kitchen levothyroxine (SYNTHROID, LEVOTHROID) 50  MCG tablet   Oral   Take 1 tablet by mouth at bedtime.          . metoprolol tartrate (LOPRESSOR) 25 MG tablet   Oral   Take 1 tablet by mouth 2 (two) times daily.         . risperiDONE (RISPERDAL) 0.5 MG tablet   Oral   Take 1 tablet by mouth 2 (two) times daily.          . traZODone (DESYREL) 50 MG tablet   Oral   Take 50 mg by mouth at bedtime.           Allergies Budesonide-formoterol fumarate; Donepezil; and Sulfa antibiotics  History reviewed. No pertinent family history.  Social History Social History  Substance Use Topics  . Smoking status: Former Research scientist (life sciences)  . Smokeless tobacco: Never Used  . Alcohol Use: No    Review of Systems  Constitutional: Negative for fever. Eyes:  ENT:  Cardiovascular: Negative for chest pain. Respiratory: Negative for shortness of breath. Gastrointestinal: Negative for abdominal pain Genitourinary:  Musculoskeletal: Negative for back pain. Skin: Negative for rash. Neurological: Negative for headache. Limited due to her critical illness ____________________________________________   PHYSICAL EXAM:  VITAL SIGNS: ED Triage Vitals  Enc Vitals Group     BP --      Pulse --      Resp --      Temp --      Temp src --      SpO2 --      Weight --      Height --      Head Cir --      Peak Flow --      Pain Score --      Pain Loc --      Pain Edu? --      Excl. in [place]? --      Constitutional: Alert and Cooperative, hard of hearing. HEENT   Head: Normocephalic and atraumatic.      Eyes: Conjunctivae are normal. PERRL. Normal extraocular movements.      Ears:         Nose: No congestion/rhinnorhea.   Mouth/Throat: Mucous membranes are moist.   Neck: No stridor. Cardiovascular/Chest: Normal rate, regular rhythm.  No murmurs, rubs, or gallops. Respiratory: Normal respiratory effort without tachypnea nor retractions. Breath sounds are clear and equal bilaterally. No wheezes/rales/rhonchi. Gastrointestinal:  Soft. No distention, no guarding, no rebound. Nontender.   Genitourinary/rectal:Large Bloody clots from rectum Musculoskeletal: Nontender with normal range of motion in all extremities. No joint effusions.  No lower extremity tenderness.  No edema. Neurologic:  Hard of hearing. No facial droop. Moving 4 extremities. No gross or focal neurologic deficits are appreciated. Skin:  Skin is warm, dry and intact. No rash noted. Psychiatric: No hallucinations  ____________________________________________   EKG I, Lisa Roca, MD, the attending physician have personally viewed and interpreted all ECGs.  64 bpm pierced to be atrial fibrillation. Left axis deviation. Nonspecific intraventricular conduction delay. Wavy interference of baseline without obvious ST segment elevation or depression or T-wave inversion. ____________________________________________  LABS (pertinent positives/negatives)  Labs Reviewed  COMPREHENSIVE METABOLIC PANEL - Abnormal; Notable for the following:    Glucose, Bld 124 (*)    BUN 28 (*)    Creatinine, Ser 1.15 (*)    Albumin 3.2 (*)    Alkaline Phosphatase 144 (*)    GFR calc non Af Amer 40 (*)    GFR calc Af Amer 46 (*)    Anion gap 4 (*)    All other components within normal limits  CBC WITH DIFFERENTIAL/PLATELET - Abnormal; Notable for the following:    RBC 3.53 (*)    Hemoglobin 9.4 (*)    HCT 29.3 (*)    RDW 18.2 (*)    Lymphs Abs 0.9 (*)    All other components within normal limits  TROPONIN I  LACTIC ACID, PLASMA  LACTIC ACID, PLASMA  PREPARE RBC (CROSSMATCH)  TYPE AND SCREEN  ABO/RH  PREPARE RBC (CROSSMATCH)  PREPARE RBC (CROSSMATCH)    ____________________________________________  RADIOLOGY All Xrays were viewed by me. Imaging interpreted by Radiologist.  None __________________________________________  PROCEDURES  Procedure(s) performed: None  Critical Care performed: CRITICAL CARE Performed by: Lisa Roca   Total critical  care time: 60 minutes  Critical care time was exclusive of separately billable procedures and treating other patients.  Critical care was necessary to treat or prevent imminent or life-threatening deterioration.  Critical care was time spent personally by me on the following activities: development of treatment plan with patient and/or surrogate as well as nursing, discussions with consultants, evaluation of patient's response to treatment, examination of patient, obtaining history from patient or surrogate, ordering and performing treatments and  interventions, ordering and review of laboratory studies, ordering and review of radiographic studies, pulse oximetry and re-evaluation of patient's condition.   ____________________________________________   ED COURSE / ASSESSMENT AND PLAN  Pertinent labs & imaging results that were available during my care of the patient were reviewed by me and considered in my medical decision making (see chart for details).   Triage complaint was blood per rectum, and her diaper had a a very large amount of bloody clots which I can only assume to be most likely a lower GI bleed, however potentially a brisk upper GI bleed could produce similarly.  No hypotension or tachycardia although the family states that the patient has actually had bradycardia in the 20s to 30s at home for which they're not seeking treatment.  The son and other family members state that the patient is a DO NOT RESUSCITATE and they do not wish to pursue endoscopy at this point in time should it the needed under emergency conditions. I brought this up because we do not have gastroenterology on-call at this hospital tonight, and so in order to have access to emergency life-saving interventions, patient would need to be transferred to another hospital.  The family does not want to be transferred. They do not want any invasive intervention that would required anesthesia such as a GI scope.  They  are at this point time interested in additional measures including fluid and blood resuscitation.  He would like to be admitted here to [facility]. They are well aware and understanding of the fact that this is an extremely critical illness which is life-threatening at this point in time.  Based on what appeared to be 4 units of blood in her diaper, and a reported large amount of blood passed once at home, I did order 2 units of emergency release o positive blood for ED transfusion clinically.  Patient's initial hemoglobin did come back reassuring at 9, however in acute blood loss, I am treating the patient clinically.  The nurses changed another diaper which had again a large amount of bloody clots. I am ordering an additional third unit of emergency release blood for transfusion here in the ED. I ordered 2 units of type specific blood to be crossmatched and ready when the patient goes upstairs.  Family was again updated as to the severity and they are aware. They are open to physician discussion if her scenario becomes futile they would be understanding of making the patient comfort care. Certainly for now, I think it's reasonable to continue some resuscitation measures.    CONSULTATIONS:   Hospitalist for admission.   Patient / Family / Caregiver informed of clinical course, medical decision-making process, and agree with plan.     ___________________________________________   FINAL CLINICAL IMPRESSION(S) / ED DIAGNOSES   Final diagnoses:  Acute GI bleeding              Note: This dictation was prepared with Dragon dictation. Any transcriptional errors that result from this process are unintentional   Lisa Roca, MD 04/22/16 2202

## 2016-04-22 NOTE — ED Notes (Signed)
Pt presents via [facility]. Per EMS pt has rectal bleeding with bright red blood that began 40 minutes PTA. Pt has no previous hx of previous GI bleed. Upon assessment, tons of clots and blood in depends. Family at bedside.

## 2016-04-23 DIAGNOSIS — Z9010 Acquired absence of unspecified breast and nipple: Secondary | ICD-10-CM | POA: Diagnosis not present

## 2016-04-23 DIAGNOSIS — Z853 Personal history of malignant neoplasm of breast: Secondary | ICD-10-CM | POA: Diagnosis not present

## 2016-04-23 DIAGNOSIS — I4891 Unspecified atrial fibrillation: Secondary | ICD-10-CM | POA: Diagnosis present

## 2016-04-23 DIAGNOSIS — I10 Essential (primary) hypertension: Secondary | ICD-10-CM

## 2016-04-23 DIAGNOSIS — Z882 Allergy status to sulfonamides status: Secondary | ICD-10-CM | POA: Diagnosis not present

## 2016-04-23 DIAGNOSIS — Z87891 Personal history of nicotine dependence: Secondary | ICD-10-CM | POA: Diagnosis not present

## 2016-04-23 DIAGNOSIS — Z9889 Other specified postprocedural states: Secondary | ICD-10-CM | POA: Diagnosis not present

## 2016-04-23 DIAGNOSIS — D649 Anemia, unspecified: Secondary | ICD-10-CM | POA: Diagnosis present

## 2016-04-23 DIAGNOSIS — Z79899 Other long term (current) drug therapy: Secondary | ICD-10-CM | POA: Diagnosis not present

## 2016-04-23 DIAGNOSIS — J449 Chronic obstructive pulmonary disease, unspecified: Secondary | ICD-10-CM

## 2016-04-23 DIAGNOSIS — F0390 Unspecified dementia without behavioral disturbance: Secondary | ICD-10-CM | POA: Diagnosis present

## 2016-04-23 DIAGNOSIS — I509 Heart failure, unspecified: Secondary | ICD-10-CM | POA: Diagnosis present

## 2016-04-23 DIAGNOSIS — D500 Iron deficiency anemia secondary to blood loss (chronic): Secondary | ICD-10-CM | POA: Diagnosis present

## 2016-04-23 DIAGNOSIS — Z7982 Long term (current) use of aspirin: Secondary | ICD-10-CM | POA: Diagnosis not present

## 2016-04-23 DIAGNOSIS — I110 Hypertensive heart disease with heart failure: Secondary | ICD-10-CM | POA: Diagnosis present

## 2016-04-23 DIAGNOSIS — Z888 Allergy status to other drugs, medicaments and biological substances status: Secondary | ICD-10-CM | POA: Diagnosis not present

## 2016-04-23 DIAGNOSIS — Z8249 Family history of ischemic heart disease and other diseases of the circulatory system: Secondary | ICD-10-CM | POA: Diagnosis not present

## 2016-04-23 DIAGNOSIS — Z66 Do not resuscitate: Secondary | ICD-10-CM | POA: Diagnosis present

## 2016-04-23 DIAGNOSIS — C50919 Malignant neoplasm of unspecified site of unspecified female breast: Secondary | ICD-10-CM

## 2016-04-23 DIAGNOSIS — K625 Hemorrhage of anus and rectum: Secondary | ICD-10-CM | POA: Diagnosis present

## 2016-04-23 DIAGNOSIS — Z90710 Acquired absence of both cervix and uterus: Secondary | ICD-10-CM | POA: Diagnosis not present

## 2016-04-23 DIAGNOSIS — K922 Gastrointestinal hemorrhage, unspecified: Secondary | ICD-10-CM | POA: Diagnosis present

## 2016-04-23 LAB — CBC
HCT: 26.3 % — ABNORMAL LOW (ref 35.0–47.0)
Hemoglobin: 8.6 g/dL — ABNORMAL LOW (ref 12.0–16.0)
MCH: 27.1 pg (ref 26.0–34.0)
MCHC: 32.7 g/dL (ref 32.0–36.0)
MCV: 83 fL (ref 80.0–100.0)
PLATELETS: 113 10*3/uL — AB (ref 150–440)
RBC: 3.17 MIL/uL — AB (ref 3.80–5.20)
RDW: 17 % — ABNORMAL HIGH (ref 11.5–14.5)
WBC: 5.5 10*3/uL (ref 3.6–11.0)

## 2016-04-23 LAB — BASIC METABOLIC PANEL
Anion gap: 1 — ABNORMAL LOW (ref 5–15)
BUN: 24 mg/dL — ABNORMAL HIGH (ref 6–20)
CHLORIDE: 110 mmol/L (ref 101–111)
CO2: 28 mmol/L (ref 22–32)
CREATININE: 0.94 mg/dL (ref 0.44–1.00)
Calcium: 8.7 mg/dL — ABNORMAL LOW (ref 8.9–10.3)
GFR, EST AFRICAN AMERICAN: 59 mL/min — AB (ref >60)
GFR, EST NON AFRICAN AMERICAN: 51 mL/min — AB (ref >60)
Glucose, Bld: 92 mg/dL (ref 65–99)
POTASSIUM: 4.1 mmol/L (ref 3.5–5.1)
SODIUM: 139 mmol/L (ref 135–145)

## 2016-04-23 LAB — HEMOGLOBIN AND HEMATOCRIT, BLOOD
HEMATOCRIT: 25.4 % — AB (ref 35.0–47.0)
HEMOGLOBIN: 8.4 g/dL — AB (ref 12.0–16.0)

## 2016-04-23 LAB — PROTIME-INR
INR: 1.46
PROTHROMBIN TIME: 17.8 s — AB (ref 11.4–15.0)

## 2016-04-23 LAB — LACTIC ACID, PLASMA: Lactic Acid, Venous: 0.9 mmol/L (ref 0.5–2.0)

## 2016-04-23 MED ORDER — METOPROLOL TARTRATE 25 MG PO TABS
12.5000 mg | ORAL_TABLET | Freq: Two times a day (BID) | ORAL | Status: DC
  Administered 2016-04-23 – 2016-04-25 (×4): 12.5 mg via ORAL
  Filled 2016-04-23 (×4): qty 1

## 2016-04-23 MED ORDER — DILTIAZEM HCL ER COATED BEADS 120 MG PO CP24
120.0000 mg | ORAL_CAPSULE | Freq: Every day | ORAL | Status: DC
  Administered 2016-04-23 – 2016-04-24 (×2): 120 mg via ORAL
  Filled 2016-04-23 (×2): qty 1

## 2016-04-23 MED ORDER — LEVOTHYROXINE SODIUM 50 MCG PO TABS
50.0000 ug | ORAL_TABLET | Freq: Every day | ORAL | Status: DC
  Administered 2016-04-24 – 2016-04-25 (×2): 50 ug via ORAL
  Filled 2016-04-23 (×2): qty 1

## 2016-04-23 MED ORDER — RISPERIDONE 1 MG PO TABS
0.5000 mg | ORAL_TABLET | Freq: Two times a day (BID) | ORAL | Status: DC
  Administered 2016-04-23 – 2016-04-25 (×4): 0.5 mg via ORAL
  Filled 2016-04-23 (×5): qty 1

## 2016-04-23 NOTE — Care Management Important Message (Signed)
Important Message  Patient Details  Name: JENEVA DRAGOVICH MRN: [id number] Date of Birth: July 17, 1920   Medicare Important Message Given:  Yes    Shelbie Ammons, RN 04/23/2016, 8:16 AM

## 2016-04-23 NOTE — ED Notes (Signed)
At this time, pt still has bair hugger on, pt lying on stretcher. Pt sleeping. Family at bedside.

## 2016-04-23 NOTE — ED Notes (Signed)
Pt depends noted to have large amount of blood and clots in it. Pt changed, cleaned, and new depends applied. New chuck and gown placed on pt.

## 2016-04-23 NOTE — Progress Notes (Signed)
Patient ID: Haley Paul, female   DOB: 23-Oct-1920, 82 y.o.   MRN: [id number] [place] [facility] NAME: Haley Paul    MR#:  [id number]  DATE OF BIRTH:  January 30, 1920  SUBJECTIVE:  Came in with bright red blood per rectum from home. Patient is status post 2 unit blood transfusion. She has very advanced dementia and not able to verbally communicate today. Family in the room. Patient's last bowel movement was more of a regular bowel movement with some Doryx and some leftover dark but he still  REVIEW OF SYSTEMS:   Review of Systems  Unable to perform ROS: dementia   Tolerating Diet: Liquid Tolerating PT: Pending  DRUG ALLERGIES:   Allergies  Allergen Reactions  . Budesonide-Formoterol Fumarate Other (See Comments)  . Donepezil Other (See Comments)  . Sulfa Antibiotics Other (See Comments)    VITALS:  Blood pressure 126/59, pulse 99, temperature 97.8 F (36.6 C), temperature source Oral, resp. rate 20, height 5\' 3"  (1.6 m), weight 65.318 kg (144 lb), SpO2 95 %.  PHYSICAL EXAMINATION:   Physical Exam  GENERAL:  82 y.o.-year-old patient lying in the bed with no acute distress.  EYES: Pupils equal, round, reactive to light and accommodation. No scleral icterus. Extraocular muscles intact.  HEENT: Head atraumatic, normocephalic. Oropharynx and nasopharynx clear.  NECK:  Supple, no jugular venous distention. No thyroid enlargement, no tenderness.  LUNGS: Normal breath sounds bilaterally, no wheezing, rales, rhonchi. No use of accessory muscles of respiration.  CARDIOVASCULAR: S1, S2 normal. No murmurs, rubs, or gallops.  ABDOMEN: Soft, nontender, nondistended. Bowel sounds present. No organomegaly or mass.  EXTREMITIES: No cyanosis, clubbing or edema b/l.    NEUROLOGICMoves all extremities well. Patient has severe dementia unable to assess at present. PSYCHIATRIC:  Patient resting quietly. Does not verbalize much. History of  severe dementia SKIN: No obvious rash, lesion, or ulcer.   LABORATORY PANEL:  CBC  Recent Labs Lab 04/23/16 0347 04/23/16 0957  WBC 5.5  --   HGB 8.6* 8.4*  HCT 26.3* 25.4*  PLT 113*  --     Chemistries   Recent Labs Lab 04/22/16 2043 04/23/16 0347  NA 137 139  K 4.9 4.1  CL 104 110  CO2 29 28  GLUCOSE 124* 92  BUN 28* 24*  CREATININE 1.15* 0.94  CALCIUM 9.5 8.7*  AST 28  --   ALT 14  --   ALKPHOS 144*  --   BILITOT 0.6  --    Cardiac Enzymes  Recent Labs Lab 04/22/16 2043  TROPONINI <0.03   RADIOLOGY:  No results found. ASSESSMENT AND PLAN:   . Rectal bleeding, Anemia  -Conservative treatment only, family does not want any aggressive colonoscopy or any luminal evaluation. We'll respect their wishes. Hold off on any GI consultation. -IV fluids. Start oral diet. Patient is status post 2 unit blood transfusion last visit is a 8.4-  COPD - stable, resume home medications   History of breast cancer - stable, aware   Atrial fibrillation - stable, continue calcium channel blocker and beta blocker. Discontinue aspirin   Hypertension - stable, home medications resumed   Severe dementia -Stable, home medications resumed   Family is agreeable to take patient back home and resume home health care if her hemoglobin remains stable and no more active bleeding occurs.  SCDs only for DVT prophylaxis  Case discussed with Care Management/Social Worker. Management plans discussed with the patient, family and they  are in agreement.  CODE STATUS: DO NOT RESUSCITATE   TOTAL TIME TAKING CARE OF THIS PATIENT: 30 minutes.  >50% time spent on counselling and coordination of care  POSSIBLE D/C IN 1-2 DAYS, DEPENDING ON CLINICAL CONDITION.  Note: This dictation was prepared with Dragon dictation along with smaller phrase technology. Any transcriptional errors that result from this process are unintentional.  Barnaby Rippeon M.D on 04/23/2016 at 3:43 PM  Between 7am  to 6pm - Pager - [phone]  After 6pm go to [email or web address] - password [email or web address] [facility]  Office  [phone]  CC: Primary care physician; Madelyn Brunner, MD

## 2016-04-23 NOTE — Plan of Care (Signed)
Problem: Education: Goal: Knowledge of [place] [facility] Education information/materials will improve Outcome: Progressing Pt likes to be called Haley Paul    Past Medical History: Past Medical History   Diagnosis  Date   .  COPD (chronic obstructive pulmonary disease) ([place])     .  Cancer ([place])     .  Breast cancer ([place])     .  CHF (congestive heart failure) ([place])     .  Atrial fibrillation ([place])     .  Hypertension     .  Dementia        Past Surgical History   Procedure  Laterality  Date   .  Abdominal hysterectomy       .  Back surgery       .  Mastectomy

## 2016-04-23 NOTE — Care Management (Signed)
Admitted to [facility] with the diagnosis of rectal bleed. Lives with son and daughter-in-law [phone] or [phone]). Last seen Dr. Lisette Grinder 04/11/16. Followed by [place] until 04/07/16. Followed by [facility] in the home now. Wheelchair and bedside commode in the home. No falls, just stumbles. Fair-good appetite. Good health until just a couple of months ago. Lives in [place]. Family at the bedside. Shelbie Ammons RN MSN CCM Care Management [phone]

## 2016-04-23 NOTE — Plan of Care (Signed)
Problem: Education: Goal: Knowledge of [place] [facility] Education information/materials will improve Outcome: Not Progressing Pt disoriented x4 and does not follow commands.  Problem: Physical Regulation: Goal: Ability to maintain clinical measurements within normal limits will improve Outcome: Progressing VSS. Small amt of dark blood noticed in stool. MD aware. Hgb 8.4. No bleeding noted. No signs of pain/n/v. Diet advanced to full liquid. Next Hgb scheduled for tomorrow am.

## 2016-04-23 NOTE — H&P (Signed)
PCP:   Madelyn Brunner, MD   Chief Complaint:  Bright red blood per rectum  HPI: This is a 82 year old female with history of severe dementia, A. fib on baby aspirin daily and history of breast cancer. She lives at home with her son and daughter-in-law. This evening when they were about to get her up from the chair to the bathroom after dinner  they noted she had copious amounts of bright red blood in her chair. They took her to the bathroom, the bleeding continued and they finally called 911 and she was brought to the ER. In the ER she has continued to bleed. Her last colonoscopy was over 20 years ago. The family declined to have her transferred to where she could have a colonoscopy and intervention. They have opted for conservative treatment including transfusions. Currently 2 units of red blood cells are being transfused, post transfusion CBC will be ordered. The family members have left numbers, if the bleeding does not stop and she continues to need transfusion they're amenable to to discontinuing resuscitative efforts. [phone] home number, [phone] cell numbers for [facility] [employer] and Wille Glaser.   Review of Systems:  The patient denies anorexia, fever, weight loss,, vision loss, decreased hearing, hoarseness, chest pain, syncope, dyspnea on exertion, peripheral edema, balance deficits, hemoptysis, abdominal pain, melena,  bright red blood per rectum, hematochezia, severe indigestion/heartburn, hematuria, incontinence, genital sores, muscle weakness, suspicious skin lesions, transient blindness, difficulty walking, depression, unusual weight change, abnormal bleeding, enlarged lymph nodes, angioedema, and breast masses.  Past Medical History: Past Medical History  Diagnosis Date  . COPD (chronic obstructive pulmonary disease) ([place])   . Cancer ([place])   . Breast cancer ([place])   . CHF (congestive heart failure) ([place])   . Atrial fibrillation ([place])   . Hypertension   . Dementia    Past Surgical  History  Procedure Laterality Date  . Abdominal hysterectomy    . Back surgery    . Mastectomy Left     Medications: Prior to Admission medications   Medication Sig Start Date End Date Taking? Authorizing Provider  aspirin EC 81 MG tablet Take 1 tablet by mouth daily. 02/28/16 02/27/17 Yes Historical Provider, MD  diltiazem (DILACOR XR) 120 MG 24 hr capsule Take 1 capsule by mouth at bedtime.  07/31/15  Yes Historical Provider, MD  docusate sodium (COLACE) 100 MG capsule Take 100 mg by mouth 2 (two) times daily.   Yes Historical Provider, MD  furosemide (LASIX) 20 MG tablet Take 20 mg by mouth daily.   Yes Historical Provider, MD  levothyroxine (SYNTHROID, LEVOTHROID) 50 MCG tablet Take 1 tablet by mouth at bedtime.  07/31/15  Yes Historical Provider, MD  metoprolol tartrate (LOPRESSOR) 25 MG tablet Take 1 tablet by mouth 2 (two) times daily. 07/31/15  Yes Historical Provider, MD  risperiDONE (RISPERDAL) 0.5 MG tablet Take 1 tablet by mouth 2 (two) times daily.  01/14/16 07/12/16 Yes Historical Provider, MD  traZODone (DESYREL) 50 MG tablet Take 50 mg by mouth at bedtime.   Yes Historical Provider, MD    Allergies:   Allergies  Allergen Reactions  . Budesonide-Formoterol Fumarate Other (See Comments)  . Donepezil Other (See Comments)  . Sulfa Antibiotics Other (See Comments)    Social History:  reports that she has quit smoking. She has never used smokeless tobacco. She reports that she does not drink alcohol. Her drug history is not on file.  Family History: History reviewed. Coronary artery disease  Physical Exam: Filed Vitals:   04/22/16 2300 04/22/16 2309 04/22/16 2315 04/22/16 2330  BP: 130/60 124/48 133/78 119/64  Pulse: 75 72 73 75  Temp:  97.1 F (36.2 C)  96.4 F (35.8 C)  TempSrc:  Axillary  Oral  Resp: 13 16 15 17   SpO2: 96% 99% 97% 98%    General: Demented, comfortable female no acute distress Eyes: PERRLA, pink conjunctiva, no scleral icterus ENT: Moist oral  mucosa, neck supple, no thyromegaly Lungs: clear to ascultation, no wheeze, no crackles, no use of accessory muscles Cardiovascular: regular rate and rhythm, no regurgitation, no gallops, no murmurs. No carotid bruits, no JVD Abdomen: soft, positive BS, non-tender, non-distended, no organomegaly, not an acute abdomen GU: Patient in the diaper which is filled with the obvious blood Neuro: UNABLE TO ASSESS DUE TO PATIENT'S DEMENTIA Musculoskeletal:unable to assess due to patient's dementia skin: no rash, no subcutaneous crepitation, no decubitus Psych: appropriate patient   Labs on Admission:   Recent Labs  04/22/16 2043  NA 137  K 4.9  CL 104  CO2 29  GLUCOSE 124*  BUN 28*  CREATININE 1.15*  CALCIUM 9.5    Recent Labs  04/22/16 2043  AST 28  ALT 14  ALKPHOS 144*  BILITOT 0.6  PROT 8.1  ALBUMIN 3.2*   No results for input(s): LIPASE, AMYLASE in the last 72 hours.  Recent Labs  04/22/16 2043  WBC 4.1  NEUTROABS 2.7  HGB 9.4*  HCT 29.3*  MCV 82.9  PLT 152    Recent Labs  04/22/16 2043  TROPONINI <0.03   Invalid input(s): POCBNP No results for input(s): DDIMER in the last 72 hours. No results for input(s): HGBA1C in the last 72 hours. No results for input(s): CHOL, HDL, LDLCALC, TRIG, CHOLHDL, LDLDIRECT in the last 72 hours. No results for input(s): TSH, T4TOTAL, T3FREE, THYROIDAB in the last 72 hours.  Invalid input(s): FREET3 No results for input(s): VITAMINB12, FOLATE, FERRITIN, TIBC, IRON, RETICCTPCT in the last 72 hours.  Micro Results: No results found for this or any previous visit (from the past 240 hour(s)).   Radiological Exams on Admission: No results found.  Assessment/Plan Present on Admission:  . Rectal bleeding . Anemia - admit to MedSurg  -Conservative treatment only, serial H&H every 6 hours. Will transfuse as needed.  -If patient continues to bleed a hemoglobin drops precipitously will contact family we discontinued intervention   -Will consult palliative   COPD - stable, resume home medications   History of breast cancer - stable, aware   Atrial fibrillation - stable, continue calcium channel blocker and beta blocker. Discontinue aspirin   Hypertension - stable, home medications resumed   Severe dementia -Stable, home medications resumed     SCDs only for DVT prophylaxis   Tanna Loeffler 04/23/2016, 12:21 AM

## 2016-04-23 NOTE — Progress Notes (Signed)
Family present.  Request to review medications.  Pt is normally on Metoprolol 25 mg .  Discussed with Dr Anselm Jungling and order to restart at 12.5 mg twice daily as Dr Serita Grit note today states continue beta blocker and calcium channel blocker for afib. Informed dtr of change. Dorna Bloom RN

## 2016-04-23 NOTE — ED Notes (Signed)
Pt depend changed. Large amount of blood and clots noted in depend. Pt cleaned, new depend placed on pt. New chucks and gown placed on pt.

## 2016-04-23 NOTE — Evaluation (Signed)
Clinical/Bedside Swallow Evaluation Patient Details  Name: Haley Paul MRN: [id number] Date of Birth: 08/11/1920  Today's Date: 04/23/2016 Time: SLP Start Time (ACUTE ONLY): 1200 SLP Stop Time (ACUTE ONLY): 1300 SLP Time Calculation (min) (ACUTE ONLY): 60 min  Past Medical History:  Past Medical History  Diagnosis Date  . COPD (chronic obstructive pulmonary disease) ([place])   . Cancer ([place])   . Breast cancer ([place])   . CHF (congestive heart failure) ([place])   . Atrial fibrillation ([place])   . Hypertension   . Dementia    Past Surgical History:  Past Surgical History  Procedure Laterality Date  . Abdominal hysterectomy    . Back surgery    . Mastectomy Left    HPI:  Pt is a 82 year old female with history of severe dementia, A. fib on baby aspirin daily, COPD, CHF, and history of breast cancer. She lives at home with her son and daughter-in-law. This evening when they were about to get her up from the chair to the bathroom after dinner they noted she had copious amounts of bright red blood in her chair. They took her to the bathroom, the bleeding continued and they finally called 911 and she was brought to the ER. In the ER she has continued to bleed. Her last colonoscopy was over 20 years ago. The family declined to have her transferred to where she could have a colonoscopy and intervention. They have opted for conservative treatment including transfusions. Pt is drowsy, sleepy. Pt can awaken but w/ verbal/tactile cues. Pt does have h/o this presentation at home per family description/report as well as declined status in general. Pt does have a baseline of Dementia.    Assessment / Plan / Recommendation Clinical Impression  Pt appeared to adequately tolerate trials of thin liquids via CUP w/ hand over hand support, then trials of puree(2), w/ no overt s/s of aspiration noted. Oral phase c/b min decreased awareness for accepting bolus trials from utensil but w/ increased verbal/tactile  cues, pt was able to orally accept, manage, transfer and clear bolus trials. Pt exhibited brief increased munching on the increased textured trials but swallowed and cleared appropriately given time. Pt required feeding assistance sec. to declined Cognitive status; moderate verbal/tactile cues for follow through w/ po tasks moreso to initiate taking po trials. Pt presents w/ increased risk for aspiration d/t declined Cognitive status but w/ 100% supervision/monitoring w/ po intake and verbal/tactile cues for awareness, pt appears able to tolerate a Puree consistency diet w/ thin liquids via CUP; aspiration precautions; Meds in puree - Crushed(as baseline at home per dtr.). MD/NSG updated and agreed. Tiem spent w/ dtr/family educating on aspiration precautions, feeding support and facilitation, diet consistency easiest for pt d/t Cognitive decline, and diet/food options.      Aspiration Risk  Mild aspiration risk (d/t baseline severe Dementia)    Diet Recommendation  Dysphagia 1(Puree foods w/ condiments) w/ thin liquids; aspiration precautions; support and facilitation at meals including hand over hand support.  Medication Administration: Crushed with puree    Other  Recommendations Recommended Consults:  (Dietician) Oral Care Recommendations: Oral care BID;Staff/trained caregiver to provide oral care   Follow up Recommendations  24 hour supervision/assistance (TBD)    Frequency and Duration            Prognosis Prognosis for Safe Diet Advancement: Fair (-Good) Barriers to Reach Goals: Cognitive deficits;Severity of deficits      Swallow Study   General Date of Onset: 04/22/16 HPI: Pt  is a 82 year old female with history of severe dementia, A. fib on baby aspirin daily, COPD, CHF, and history of breast cancer. She lives at home with her son and daughter-in-law. This evening when they were about to get her up from the chair to the bathroom after dinner they noted she had copious amounts of  bright red blood in her chair. They took her to the bathroom, the bleeding continued and they finally called 911 and she was brought to the ER. In the ER she has continued to bleed. Her last colonoscopy was over 20 years ago. The family declined to have her transferred to where she could have a colonoscopy and intervention. They have opted for conservative treatment including transfusions. Pt is drowsy, sleepy. Pt can awaken but w/ verbal/tactile cues. Pt does have h/o this presentation at home per family description/report as well as declined status in general. Pt does have a baseline of Dementia.  Type of Study: Bedside Swallow Evaluation Previous Swallow Assessment: none Diet Prior to this Study: Dysphagia 3 (soft);Thin liquids (family cuts meats) Temperature Spikes Noted: No (wbc 5.5) Respiratory Status: Room air History of Recent Intubation: No Behavior/Cognition: Cooperative;Pleasant mood;Confused;Lethargic/Drowsy;Requires cueing Oral Cavity Assessment: Dry (appeared; brief assessment d/t Cognitive status) Oral Care Completed by SLP: Recent completion by staff Oral Cavity - Dentition: Adequate natural dentition Vision:  (n/a) Self-Feeding Abilities: Total assist Patient Positioning: Upright in bed Baseline Vocal Quality: Normal (1 single word response; nonverbal otherwise) Volitional Cough: Cognitively unable to elicit Volitional Swallow: Unable to elicit    Oral/Motor/Sensory Function Overall Oral Motor/Sensory Function:  (unable to fully assess d/t Cognitive decline)   Ice Chips Ice chips: Within functional limits Presentation: Spoon (fed; 3 trials) Other Comments: min reduced awareness taking the chips from spoon; responded appropriately to tactile stim. to take po trials.   Thin Liquid Thin Liquid: Impaired Presentation: Cup (fully assisted; 3 trials) Oral Phase Impairments: Poor awareness of bolus Oral Phase Functional Implications:  (none) Pharyngeal  Phase Impairments:  (none)     Nectar Thick Nectar Thick Liquid: Not tested   Honey Thick Honey Thick Liquid: Not tested   Puree Puree: Impaired Presentation: Spoon (fed; 2 trials) Oral Phase Impairments: Poor awareness of bolus Oral Phase Functional Implications:  (brief, min increased munching/chewing motions) Pharyngeal Phase Impairments:  (none)   Solid   GO   Solid: Not tested        Orinda Kenner, MS, CCC-SLP  Watson,Katherine 04/23/2016,2:22 PM

## 2016-04-24 LAB — HEMOGLOBIN: HEMOGLOBIN: 7.2 g/dL — AB (ref 12.0–16.0)

## 2016-04-24 MED ORDER — METOPROLOL TARTRATE 25 MG PO TABS: 12.5000 mg | ORAL_TABLET | Freq: Two times a day (BID) | ORAL | Status: AC

## 2016-04-24 MED ORDER — FUROSEMIDE 20 MG PO TABS
20.0000 mg | ORAL_TABLET | Freq: Every day | ORAL | Status: DC
  Administered 2016-04-24 – 2016-04-25 (×2): 20 mg via ORAL
  Filled 2016-04-24 (×3): qty 1

## 2016-04-24 MED ORDER — LEVOTHYROXINE SODIUM 50 MCG PO TABS: 50.0000 ug | ORAL_TABLET | Freq: Every day | ORAL | Status: DC

## 2016-04-24 MED ORDER — TRAZODONE HCL 50 MG PO TABS
50.0000 mg | ORAL_TABLET | Freq: Every day | ORAL | Status: DC
  Administered 2016-04-24: 50 mg via ORAL
  Filled 2016-04-24: qty 1

## 2016-04-24 MED ORDER — DOCUSATE SODIUM 100 MG PO CAPS
100.0000 mg | ORAL_CAPSULE | Freq: Two times a day (BID) | ORAL | Status: DC
  Administered 2016-04-24 – 2016-04-25 (×3): 100 mg via ORAL
  Filled 2016-04-24 (×4): qty 1

## 2016-04-24 NOTE — Plan of Care (Signed)
Problem: Education: Goal: Knowledge of [facility] Education information/materials will improve Outcome: Progressing edu to family. md spoke with family this am  Problem: Safety: Goal: Ability to remain free from injury will improve Outcome: Progressing No injury  Problem: Health Behavior/Discharge Planning: Goal: Ability to manage health-related needs will improve Outcome: Not Progressing Condition remains unchanged. Pt had dementia. From home with family.  Cont to moniter  Hgb,  hgb 7.2 today from 8.4.

## 2016-04-24 NOTE — Evaluation (Signed)
Physical Therapy Evaluation Patient Details Name: Haley Paul MRN: [id number] DOB: 10-05-1920 Today's Date: 04/24/2016   History of Present Illness  This is a 81 year old female with history of severe dementia, A. fib on baby aspirin daily and history of breast cancer. She lives at home with her son and daughter-in-law. This evening when they were about to get her up from the chair to the bathroom after dinner they noted she had copious amounts of bright red blood in her chair. They took her to the bathroom, the bleeding continued and they finally called 911 and she was brought to the ER. In the ER she has continued to bleed. Her last colonoscopy was over 20 years ago. The family declined to have her transferred to where she could have a colonoscopy and intervention. They have opted for conservative treatment including transfusions. Pt has received multiple transfusions at the time of PT evaluation. Son and daughter in law present for evaluation  Clinical Impression  Pt has advanced dementia but is fairly alert for PT session following 75% of simple commands with hand over hand assistance. She required minA+1 for bed mobility, transfers, and ambulation with rolling walker. She is able to ambulate 5' forward but has difficulty understanding commands for turning to return to bed. Bed must be brought up to patient in order for her to sit. Per family at baseline pt is supervision for transfers and can ambulate functional household distances with mostly CGA only. She is currently deviated from her baseline and would benefit from SNF placement in order to return to prior level of function. Pt will benefit from skilled PT services to address deficits in strength, balance, and mobility in order to return to full function at home.     Follow Up Recommendations SNF    Equipment Recommendations  None recommended by PT    Recommendations for Other Services       Precautions / Restrictions  Precautions Precautions: Fall Restrictions Weight Bearing Restrictions: No      Mobility  Bed Mobility Overal bed mobility: Needs Assistance Bed Mobility: Supine to Sit     Supine to sit: Min assist     General bed mobility comments: Pt requires hand over hand and minA+1 for supine to sit. Heavy cues however pt able to follow approximately 75% of commands.  Transfers Overall transfer level: Needs assistance Equipment used: Rolling walker (2 wheeled) Transfers: Sit to/from Stand Sit to Stand: Min assist         General transfer comment: Pt requires inA+1 due to poor anterior weight shifting. She demonstrates generalized weakness in bilateral LE but reasonable functional for sit to stand transfer from regular height hospital bed. Once upright pt requires heavy cues for anterior weight shifting and upright posture. Cannot maintain balance without minA+1 support. Heavy support from bed on back of knees  Ambulation/Gait Ambulation/Gait assistance: Min guard Ambulation Distance (Feet): 5 Feet Assistive device: Rolling walker (2 wheeled) Gait Pattern/deviations: Decreased step length - right;Decreased step length - left Gait velocity: Decreased Gait velocity interpretation: <1.8 ft/sec, indicative of risk for recurrent falls General Gait Details: Pt takes short shuffling steps requiring heavy cues for proper safety and sequencing. She requires heavy cues and assist for turns and is only able to make a 45 degree turn. Will not turn a full 180 degrees to return to bed. Continual minA+1 required for balance. Pt unable to shift weight forward adequately to maintain balance  Stairs  Wheelchair Mobility    Modified Rankin (Stroke Patients Only)       Balance Overall balance assessment: Needs assistance Sitting-balance support: No upper extremity supported Sitting balance-Leahy Scale: Fair     Standing balance support: Bilateral upper extremity  supported Standing balance-Leahy Scale: Poor                               Pertinent Vitals/Pain Pain Assessment: Faces Faces Pain Scale: No hurt    Home Living Family/patient expects to be discharged to:: [place]: Children;Other relatives Available Help at Discharge: Family;Personal care attendant Type of Home: House Home Access: Stairs to enter Entrance Stairs-Rails: None Entrance Stairs-Number of Steps: 1 Home Layout: One level Home Equipment: Dana - 4 wheels;Bedside commode;Shower seat;Wheelchair - manual      Prior Function Level of Independence: Needs assistance   Gait / Transfers Assistance Needed: Pt requires intermittent minA+1 to ambulate functional household distances. She is mostly supervision for transfers  ADL's / Homemaking Assistance Needed: Pt requires assist with ADLs/IADLs        Hand Dominance        Extremity/Trunk Assessment   Upper Extremity Assessment: Generalized weakness           Lower Extremity Assessment: Generalized weakness         Communication   Communication: HOH  Cognition Arousal/Alertness: Awake/alert Behavior During Therapy: Flat affect Overall Cognitive Status: History of cognitive impairments - at baseline       Memory: Decreased short-term memory;Decreased recall of precautions              General Comments      Exercises        Assessment/Plan    PT Assessment Patient needs continued PT services  PT Diagnosis Difficulty walking;Abnormality of gait;Generalized weakness   PT Problem List Decreased strength;Decreased activity tolerance;Decreased balance;Decreased mobility;Decreased knowledge of use of DME;Decreased safety awareness;Decreased cognition  PT Treatment Interventions DME instruction;Gait training;Stair training;Functional mobility training;Therapeutic activities;Therapeutic exercise;Balance training;Neuromuscular re-education;Cognitive  remediation;Patient/family education;Wheelchair mobility training   PT Goals (Current goals can be found in the Care Plan section) Acute Rehab PT Goals Patient Stated Goal: Return to prior level of function PT Goal Formulation: With family (Pt unable to participate) Time For Goal Achievement: 05/08/16 Potential to Achieve Goals: Fair    Frequency Min 2X/week   Barriers to discharge        Co-evaluation               End of Session Equipment Utilized During Treatment: Gait belt Activity Tolerance: Other (comment) (Mildly limited due to dementia) Patient left: in bed;with call bell/phone within reach;with bed alarm set;with family/visitor present           Time: [id number] PT Time Calculation (min) (ACUTE ONLY): 31 min   Charges:   PT Evaluation $PT Eval Moderate Complexity: 1 Procedure PT Treatments $Therapeutic Activity: 8-22 mins   PT G Codes:       Lyndel Safe Avika Carbine PT, DPT   Armand Preast 04/24/2016, 1:19 PM

## 2016-04-24 NOTE — Progress Notes (Signed)
Patient ID: Haley Paul, female   DOB: Apr 28, 1920, 82 y.o.   MRN: [id number] PT recommending rehab. Family agreeable with it. Will d/w CSW for d/c planning

## 2016-04-24 NOTE — Progress Notes (Signed)
Speech Therapy Note: reviewed chart notes; met w/ pt this AM; family arrived also. Pt was sleepy, lethargic not ready for breakfast meal at the time. Spoke w/ pt's family re: diet consistency/options and general aspiration precautions. Will f/u w/ pt later today when pt is more awake/alert even w/ a meal hopefully. NSG updated and agreed.

## 2016-04-24 NOTE — Discharge Summary (Deleted)
[facility] [place] NAME: Haley Paul    MR#:  [id number]  DATE OF BIRTH:  08/05/1920  DATE OF ADMISSION:  04/22/2016 ADMITTING PHYSICIAN: Quintella Baton, MD  DATE OF DISCHARGE:04/24/16  PRIMARY CARE PHYSICIAN: Madelyn Brunner, MD    ADMISSION DIAGNOSIS:  Acute GI bleeding [K92.2]  DISCHARGE DIAGNOSIS:  BRBPR -resolving Posthemorrhagic anemia s/p 2 unit BT Advance Dementia  SECONDARY DIAGNOSIS:   Past Medical History  Diagnosis Date  . COPD (chronic obstructive pulmonary disease) ([place])   . Cancer ([place])   . Breast cancer ([place])   . CHF (congestive heart failure) ([place])   . Atrial fibrillation ([place])   . Hypertension   . Dementia     HOSPITAL COURSE:   . Rectal bleeding with posthemorrhagic Anemia  -Conservative treatment only, family does not want any aggressive colonoscopy or any luminal evaluation.  We'll respect their wishes. Hold off on any GI consultation. -received IV fluids. Tolerating oral diet. Patient is status post 2 unit blood transfusion last visit is a 8.4---7.2 -no active bleeding -some drop in hgb would be dilutional due to IVF recieved  COPD - stable, resume home medications   History of breast cancer - stable, aware   Atrial fibrillation - stable, continue calcium channel blocker and beta blocker. Discontinue aspirin   Hypertension - stable, home medications resumed   Severe dementia -Stable, home medications resumed   Family is agreeable to take patient back home and resume home health care if her hemoglobin remains stable and no more active bleeding occurs.  SCDs only for DVT prophylaxis  D/c home later today with [facility] CONSULTS OBTAINED:     DRUG ALLERGIES:   Allergies  Allergen Reactions  . Budesonide-Formoterol Fumarate Other (See Comments)  . Donepezil Other (See Comments)  . Sulfa Antibiotics Other (See Comments)    DISCHARGE MEDICATIONS:   Current Discharge  Medication List    CONTINUE these medications which have CHANGED   Details  metoprolol tartrate (LOPRESSOR) 25 MG tablet Take 0.5 tablets (12.5 mg total) by mouth 2 (two) times daily. Qty: 30 tablet, Refills: 1      CONTINUE these medications which have NOT CHANGED   Details  diltiazem (DILACOR XR) 120 MG 24 hr capsule Take 1 capsule by mouth at bedtime.     docusate sodium (COLACE) 100 MG capsule Take 100 mg by mouth 2 (two) times daily.    furosemide (LASIX) 20 MG tablet Take 20 mg by mouth daily.    levothyroxine (SYNTHROID, LEVOTHROID) 50 MCG tablet Take 1 tablet by mouth at bedtime.     risperiDONE (RISPERDAL) 0.5 MG tablet Take 1 tablet by mouth 2 (two) times daily.     traZODone (DESYREL) 50 MG tablet Take 50 mg by mouth at bedtime.      STOP taking these medications     aspirin EC 81 MG tablet         If you experience worsening of your admission symptoms, develop shortness of breath, life threatening emergency, suicidal or homicidal thoughts you must seek medical attention immediately by calling 911 or calling your MD immediately  if symptoms less severe.  You Must read complete instructions/literature along with all the possible adverse reactions/side effects for all the Medicines you take and that have been prescribed to you. Take any new Medicines after you have completely understood and accept all the possible adverse reactions/side effects.   Please note  You were cared for by  a hospitalist during your hospital stay. If you have any questions about your discharge medications or the care you received while you were in the hospital after you are discharged, you can call the unit and asked to speak with the hospitalist on call if the hospitalist that took care of you is not available. Once you are discharged, your primary care physician will handle any further medical issues. Please note that NO REFILLS for any discharge medications will be authorized once you are  discharged, as it is imperative that you return to your primary care physician (or establish a relationship with a primary care physician if you do not have one) for your aftercare needs so that they can reassess your need for medications and monitor your lab values. Today   SUBJECTIVE   Confused. No more BRBPR reported by nursing staff  VITAL SIGNS:  Blood pressure 119/51, pulse 75, temperature 97.5 F (36.4 C), temperature source Oral, resp. rate 20, height 5\' 3"  (1.6 m), weight 65.318 kg (144 lb), SpO2 95 %.  I/O:   Intake/Output Summary (Last 24 hours) at 04/24/16 0815 Last data filed at 04/24/16 0530  Gross per 24 hour  Intake    168 ml  Output      5 ml  Net    163 ml    PHYSICAL EXAMINATION:  GENERAL:  82 y.o.-year-old patient lying in the bed with no acute distress.  EYES: Pupils equal, round, reactive to light and accommodation. No scleral icterus. Extraocular muscles intact.  HEENT: Head atraumatic, normocephalic. Oropharynx and nasopharynx clear.  NECK:  Supple, no jugular venous distention. No thyroid enlargement, no tenderness.  LUNGS: Normal breath sounds bilaterally, no wheezing, rales,rhonchi or crepitation. No use of accessory muscles of respiration.  CARDIOVASCULAR: S1, S2 normal. No murmurs, rubs, or gallops.  ABDOMEN: Soft, non-tender, non-distended. Bowel sounds present. No organomegaly or mass.  EXTREMITIES: No pedal edema, cyanosis, or clubbing.  NEUROLOGIC: moves all extremities well PSYCHIATRIC:  patient is alert and confused SKIN: No obvious rash, lesion, or ulcer.   DATA REVIEW:   CBC   Recent Labs Lab 04/23/16 0347 04/23/16 0957 04/24/16 0617  WBC 5.5  --   --   HGB 8.6* 8.4* 7.2*  HCT 26.3* 25.4*  --   PLT 113*  --   --     Chemistries   Recent Labs Lab 04/22/16 2043 04/23/16 0347  NA 137 139  K 4.9 4.1  CL 104 110  CO2 29 28  GLUCOSE 124* 92  BUN 28* 24*  CREATININE 1.15* 0.94  CALCIUM 9.5 8.7*  AST 28  --   ALT 14  --    ALKPHOS 144*  --   BILITOT 0.6  --    Management plans discussed with family and they are in agreement.  CODE STATUS:     Code Status Orders        Start     Ordered   04/23/16 0304  Do not attempt resuscitation (DNR)   Continuous    Question Answer Comment  In the event of cardiac or respiratory ARREST Do not call a "code blue"   In the event of cardiac or respiratory ARREST Do not perform Intubation, CPR, defibrillation or ACLS   In the event of cardiac or respiratory ARREST Use medication by any route, position, wound care, and other measures to relive pain and suffering. May use oxygen, suction and manual treatment of airway obstruction as needed for comfort.      04/23/16  0303    Code Status History    Date Active Date Inactive Code Status Order ID Comments User Context   This patient has a current code status but no historical code status.    Advance Directive Documentation        Most Recent Value   Type of Advance Directive  Out of facility DNR (pink MOST or yellow form), Healthcare Power of Attorney   Pre-existing out of facility DNR order (yellow form or pink MOST form)     "MOST" Form in Place?        TOTAL TIME TAKING CARE OF THIS PATIENT: 40 minutes.    Quiana Cobaugh M.D on 04/24/2016 at 8:15 AM  Between 7am to 6pm - Pager - [phone] After 6pm go to [email or web address] - password [email or web address] [facility]  Office  [phone]  CC: Primary care physician; Madelyn Brunner, MD

## 2016-04-25 LAB — PREPARE RBC (CROSSMATCH)

## 2016-04-25 NOTE — Care Management (Signed)
Discharge to home today per Dr. Fritzi Mandes. Will be followed by [facility] for Nursing, physical therapy, nursing assistant, and social worker. All information faxed to [facility]. Family will transport and are in agreement with plans. Shelbie Ammons RN MSN CCM Care Management [phone]

## 2016-04-25 NOTE — Progress Notes (Signed)
Pt has been discharged home with home health. Discharge instructions given and explained to pt's son. Pt's son verbalized understanding. F/U appointment and meds reviewed with pt's son. RX sent to pharmacy by MD.

## 2016-04-25 NOTE — Clinical Social Work Note (Signed)
CSW consulted for New SNF. CSW and RNCM met with pt's son to address discharge plan. Pt has Medicare and has not had a 3 night inpatient qualifying stay, therefore Medicare will not cover STR. Pt does not have Medicaid. PT is recommending SNF, however pt is unable to pay privately. Pt's son understands that pt will return home. RNCM will follow for discharge planning needs. CSW is signing off as no further needs identified.   Darden Dates, MSW, LCSW  Clinical Social Worker  [phone]

## 2016-04-25 NOTE — Discharge Summary (Signed)
[place] [facility] [place] NAME: Haley Paul    MR#:  [id number]  DATE OF BIRTH:  1920/04/15  DATE OF ADMISSION:  04/22/2016 ADMITTING PHYSICIAN: Quintella Baton, MD  DATE OF DISCHARGE:04/25/16  PRIMARY CARE PHYSICIAN: Madelyn Brunner, MD    ADMISSION DIAGNOSIS:  Acute GI bleeding [K92.2]  DISCHARGE DIAGNOSIS:  BRBPR -resolving Posthemorrhagic anemia s/p 2 unit BT Advance Dementia  SECONDARY DIAGNOSIS:   Past Medical History  Diagnosis Date  . COPD (chronic obstructive pulmonary disease) ([place])   . Cancer ([place])   . Breast cancer ([place])   . CHF (congestive heart failure) ([place])   . Atrial fibrillation ([place])   . Hypertension   . Dementia     HOSPITAL COURSE:   . Rectal bleeding with posthemorrhagic Anemia  -Conservative treatment only, family does not want any aggressive colonoscopy or any luminal evaluation.  We'll respect their wishes. Hold off on any GI consultation. -received IV fluids. Tolerating oral diet. Patient is status post 2 unit blood transfusion last visit is a 8.4---7.2 -no active bleeding -some drop in hgb would be dilutional due to IVF received -no more Gi bleeding  COPD - stable, resume home medications   History of breast cancer - stable, aware   Atrial fibrillation - stable, continue calcium channel blocker and beta blocker. Discontinue aspirin   Hypertension - stable, home medications resumed   Severe dementia -Stable, home medications resumed   SCDs only for DVT prophylaxis  PT recommends rehab D/c to [facility]  CONSULTS OBTAINED:     DRUG ALLERGIES:   Allergies  Allergen Reactions  . Budesonide-Formoterol Fumarate Other (See Comments)  . Donepezil Other (See Comments)  . Sulfa Antibiotics Other (See Comments)    DISCHARGE MEDICATIONS:   Current Discharge Medication List    CONTINUE these medications which have CHANGED   Details  metoprolol tartrate (LOPRESSOR) 25 MG tablet  Take 0.5 tablets (12.5 mg total) by mouth 2 (two) times daily. Qty: 30 tablet, Refills: 1      CONTINUE these medications which have NOT CHANGED   Details  diltiazem (DILACOR XR) 120 MG 24 hr capsule Take 1 capsule by mouth at bedtime.     docusate sodium (COLACE) 100 MG capsule Take 100 mg by mouth 2 (two) times daily.    furosemide (LASIX) 20 MG tablet Take 20 mg by mouth daily.    levothyroxine (SYNTHROID, LEVOTHROID) 50 MCG tablet Take 1 tablet by mouth at bedtime.     risperiDONE (RISPERDAL) 0.5 MG tablet Take 1 tablet by mouth 2 (two) times daily.     traZODone (DESYREL) 50 MG tablet Take 50 mg by mouth at bedtime.      STOP taking these medications     aspirin EC 81 MG tablet         If you experience worsening of your admission symptoms, develop shortness of breath, life threatening emergency, suicidal or homicidal thoughts you must seek medical attention immediately by calling 911 or calling your MD immediately  if symptoms less severe.  You Must read complete instructions/literature along with all the possible adverse reactions/side effects for all the Medicines you take and that have been prescribed to you. Take any new Medicines after you have completely understood and accept all the possible adverse reactions/side effects.   Please note  You were cared for by a hospitalist during your hospital stay. If you have any questions about your discharge medications or the care you received  while you were in the hospital after you are discharged, you can call the unit and asked to speak with the hospitalist on call if the hospitalist that took care of you is not available. Once you are discharged, your primary care physician will handle any further medical issues. Please note that NO REFILLS for any discharge medications will be authorized once you are discharged, as it is imperative that you return to your primary care physician (or establish a relationship with a primary care  physician if you do not have one) for your aftercare needs so that they can reassess your need for medications and monitor your lab values. Today   SUBJECTIVE   Confused. No more BRBPR reported by nursing staff  VITAL SIGNS:  Blood pressure 104/48, pulse 82, temperature 97.5 F (36.4 C), temperature source Oral, resp. rate 18, height 5\' 3"  (1.6 m), weight 65.318 kg (144 lb), SpO2 98 %.  I/O:    Intake/Output Summary (Last 24 hours) at 04/25/16 0830 Last data filed at 04/24/16 2300  Gross per 24 hour  Intake    240 ml  Output      1 ml  Net    239 ml    PHYSICAL EXAMINATION:  GENERAL:  81 y.o.-year-old patient lying in the bed with no acute distress.  EYES: Pupils equal, round, reactive to light and accommodation. No scleral icterus. Extraocular muscles intact.  HEENT: Head atraumatic, normocephalic. Oropharynx and nasopharynx clear.  NECK:  Supple, no jugular venous distention. No thyroid enlargement, no tenderness.  LUNGS: Normal breath sounds bilaterally, no wheezing, rales,rhonchi or crepitation. No use of accessory muscles of respiration.  CARDIOVASCULAR: S1, S2 normal. No murmurs, rubs, or gallops.  ABDOMEN: Soft, non-tender, non-distended. Bowel sounds present. No organomegaly or mass.  EXTREMITIES: No pedal edema, cyanosis, or clubbing.  NEUROLOGIC: moves all extremities well PSYCHIATRIC:  patient is alert and confused SKIN: No obvious rash, lesion, or ulcer.   DATA REVIEW:   CBC   Recent Labs Lab 04/23/16 0347 04/23/16 0957 04/24/16 0617  WBC 5.5  --   --   HGB 8.6* 8.4* 7.2*  HCT 26.3* 25.4*  --   PLT 113*  --   --     Chemistries   Recent Labs Lab 04/22/16 2043 04/23/16 0347  NA 137 139  K 4.9 4.1  CL 104 110  CO2 29 28  GLUCOSE 124* 92  BUN 28* 24*  CREATININE 1.15* 0.94  CALCIUM 9.5 8.7*  AST 28  --   ALT 14  --   ALKPHOS 144*  --   BILITOT 0.6  --    Management plans discussed with family and they are in agreement.  CODE STATUS:      Code Status Orders        Start     Ordered   04/23/16 0304  Do not attempt resuscitation (DNR)   Continuous    Question Answer Comment  In the event of cardiac or respiratory ARREST Do not call a "code blue"   In the event of cardiac or respiratory ARREST Do not perform Intubation, CPR, defibrillation or ACLS   In the event of cardiac or respiratory ARREST Use medication by any route, position, wound care, and other measures to relive pain and suffering. May use oxygen, suction and manual treatment of airway obstruction as needed for comfort.      04/23/16 0303    Code Status History    Date Active Date Inactive Code Status Order ID Comments  User Context   This patient has a current code status but no historical code status.    Advance Directive Documentation        Most Recent Value   Type of Advance Directive  Out of facility DNR (pink MOST or yellow form), Healthcare Power of Attorney   Pre-existing out of facility DNR order (yellow form or pink MOST form)     "MOST" Form in Place?        TOTAL TIME TAKING CARE OF THIS PATIENT: 40 minutes.    Elba Schaber M.D on 04/25/2016 at 8:30 AM  Between 7am to 6pm - Pager - [phone] After 6pm go to [email or web address] - password [email or web address] [facility]  Office  [phone]  CC: Primary care physician; Madelyn Brunner, MD

## 2016-04-25 NOTE — Progress Notes (Signed)
Patient ID: Haley Paul, female   DOB: 11/28/1919, 82 y.o.   MRN: [id number] [place] [facility] [place] NAME: Haley Paul    MR#:  [id number]  DATE OF BIRTH:  1920-01-07  SUBJECTIVE:  No BRBPR  REVIEW OF SYSTEMS:   Review of Systems  Unable to perform ROS: dementia   Tolerating Diet:yes Tolerating PT: recommends rehab  DRUG ALLERGIES:   Allergies  Allergen Reactions  . Budesonide-Formoterol Fumarate Other (See Comments)  . Donepezil Other (See Comments)  . Sulfa Antibiotics Other (See Comments)    VITALS:  Blood pressure 104/48, pulse 82, temperature 97.5 F (36.4 C), temperature source Oral, resp. rate 18, height 5\' 3"  (1.6 m), weight 65.318 kg (144 lb), SpO2 98 %.  PHYSICAL EXAMINATION:   Physical Exam  GENERAL:  82 y.o.-year-old patient lying in the bed with no acute distress. Pallor+ EYES: Pupils equal, round, reactive to light and accommodation. No scleral icterus. Extraocular muscles intact.  HEENT: Head atraumatic, normocephalic. Oropharynx and nasopharynx clear.  NECK:  Supple, no jugular venous distention. No thyroid enlargement, no tenderness.  LUNGS: Normal breath sounds bilaterally, no wheezing, rales, rhonchi. No use of accessory muscles of respiration.  CARDIOVASCULAR: S1, S2 normal. No murmurs, rubs, or gallops.  ABDOMEN: Soft, nontender, nondistended. Bowel sounds present. No organomegaly or mass.  EXTREMITIES: No cyanosis, clubbing or edema b/l.    NEUROLOGIC: No focal Motor or sensory deficits b/l.   PSYCHIATRIC:  patient is alert but confused due to dementia SKIN: No obvious rash, lesion, or ulcer.   LABORATORY PANEL:  CBC  Recent Labs Lab 04/23/16 0347 04/23/16 0957 04/24/16 0617  WBC 5.5  --   --   HGB 8.6* 8.4* 7.2*  HCT 26.3* 25.4*  --   PLT 113*  --   --     Chemistries   Recent Labs Lab 04/22/16 2043 04/23/16 0347  NA 137 139  K 4.9 4.1  CL 104 110  CO2 29 28  GLUCOSE  124* 92  BUN 28* 24*  CREATININE 1.15* 0.94  CALCIUM 9.5 8.7*  AST 28  --   ALT 14  --   ALKPHOS 144*  --   BILITOT 0.6  --    Cardiac Enzymes  Recent Labs Lab 04/22/16 2043  TROPONINI <0.03   RADIOLOGY:  No results found. ASSESSMENT AND PLAN:  Rectal bleeding with posthemorrhagic Anemia  -Conservative treatment only, family does not want any aggressive colonoscopy or any luminal evaluation.  We'll respect their wishes. Hold off on any GI consultation. -received IV fluids. Tolerating oral diet. Patient is status post 2 unit blood transfusion last visit is a 8.4---7.2 -no active bleeding -some drop in hgb would be dilutional due to IVF recieved  COPD - stable, resume home medications   History of breast cancer - stable, aware   Atrial fibrillation - stable, continue calcium channel blocker and beta blocker. Discontinue aspirin   Hypertension - stable, home medications resumed   Severe dementia -Stable, home medications resumed   SCDs only for DVT prophylaxis PT to see pt  Case discussed with Care Management/Social Worker. Management plans discussed with the family and they are in agreement.  CODE STATUS: DNR  TOTAL TIME TAKING CARE OF THIS PATIENT:40 minutes.  >50% time spent on counselling and coordination of care  POSSIBLE D/C today  DEPENDING ON CLINICAL CONDITION.  Note: This dictation was prepared with Dragon dictation along with smaller phrase technology. Any transcriptional errors that  result from this process are unintentional.  Toby Ayad M.D on 04/25/2016 at 8:31 AM  Between 7am to 6pm - Pager - [phone]  After 6pm go to [email or web address] - password [email or web address] [facility]  Office  [phone]  CC: Primary care physician; Madelyn Brunner, MD

## 2016-04-25 NOTE — Care Management Important Message (Signed)
Important Message  Patient Details  Name: Haley Paul MRN: [id number] Date of Birth: 1920-10-09   Medicare Important Message Given:  Yes    Shelbie Ammons, RN 04/25/2016, 9:36 AM

## 2016-04-26 LAB — TYPE AND SCREEN
ABO/RH(D): O NEG
Antibody Screen: NEGATIVE
UNIT DIVISION: 0
UNIT DIVISION: 0
Unit division: 0
Unit division: 0
Unit division: 0

## 2016-05-30 ENCOUNTER — Encounter: Payer: Self-pay | Admitting: *Deleted

## 2016-05-30 ENCOUNTER — Inpatient Hospital Stay
Admission: EM | Admit: 2016-05-30 | Discharge: 2016-06-01 | DRG: 379 | Disposition: A | Discharge status: Home Health | Payer: Medicare Other | Attending: Internal Medicine | Admitting: Internal Medicine

## 2016-05-30 DIAGNOSIS — F0390 Unspecified dementia without behavioral disturbance: Secondary | ICD-10-CM | POA: Diagnosis present

## 2016-05-30 DIAGNOSIS — I482 Chronic atrial fibrillation: Secondary | ICD-10-CM | POA: Diagnosis present

## 2016-05-30 DIAGNOSIS — Z882 Allergy status to sulfonamides status: Secondary | ICD-10-CM

## 2016-05-30 DIAGNOSIS — Z79899 Other long term (current) drug therapy: Secondary | ICD-10-CM

## 2016-05-30 DIAGNOSIS — K625 Hemorrhage of anus and rectum: Secondary | ICD-10-CM

## 2016-05-30 DIAGNOSIS — I509 Heart failure, unspecified: Secondary | ICD-10-CM | POA: Diagnosis present

## 2016-05-30 DIAGNOSIS — Z853 Personal history of malignant neoplasm of breast: Secondary | ICD-10-CM

## 2016-05-30 DIAGNOSIS — K921 Melena: Secondary | ICD-10-CM

## 2016-05-30 DIAGNOSIS — Z888 Allergy status to other drugs, medicaments and biological substances status: Secondary | ICD-10-CM

## 2016-05-30 DIAGNOSIS — Z7982 Long term (current) use of aspirin: Secondary | ICD-10-CM

## 2016-05-30 DIAGNOSIS — J449 Chronic obstructive pulmonary disease, unspecified: Secondary | ICD-10-CM | POA: Diagnosis present

## 2016-05-30 DIAGNOSIS — R1310 Dysphagia, unspecified: Secondary | ICD-10-CM | POA: Diagnosis present

## 2016-05-30 DIAGNOSIS — K5791 Diverticulosis of intestine, part unspecified, without perforation or abscess with bleeding: Secondary | ICD-10-CM | POA: Diagnosis not present

## 2016-05-30 DIAGNOSIS — Z66 Do not resuscitate: Secondary | ICD-10-CM | POA: Diagnosis present

## 2016-05-30 DIAGNOSIS — Z87891 Personal history of nicotine dependence: Secondary | ICD-10-CM

## 2016-05-30 DIAGNOSIS — K922 Gastrointestinal hemorrhage, unspecified: Secondary | ICD-10-CM

## 2016-05-30 DIAGNOSIS — I110 Hypertensive heart disease with heart failure: Secondary | ICD-10-CM | POA: Diagnosis present

## 2016-05-30 DIAGNOSIS — Z9012 Acquired absence of left breast and nipple: Secondary | ICD-10-CM

## 2016-05-30 DIAGNOSIS — E039 Hypothyroidism, unspecified: Secondary | ICD-10-CM | POA: Diagnosis present

## 2016-05-30 LAB — COMPREHENSIVE METABOLIC PANEL
ALBUMIN: 3.5 g/dL (ref 3.5–5.0)
ALK PHOS: 112 U/L (ref 38–126)
ALT: 11 U/L — ABNORMAL LOW (ref 14–54)
AST: 22 U/L (ref 15–41)
Anion gap: 5 (ref 5–15)
BILIRUBIN TOTAL: 0.4 mg/dL (ref 0.3–1.2)
BUN: 18 mg/dL (ref 6–20)
CALCIUM: 9.8 mg/dL (ref 8.9–10.3)
CO2: 29 mmol/L (ref 22–32)
Chloride: 104 mmol/L (ref 101–111)
Creatinine, Ser: 0.81 mg/dL (ref 0.44–1.00)
GFR calc Af Amer: >60 mL/min (ref >60)
GFR calc non Af Amer: >60 mL/min (ref >60)
GLUCOSE: 102 mg/dL — AB (ref 65–99)
Potassium: 4.2 mmol/L (ref 3.5–5.1)
Sodium: 138 mmol/L (ref 135–145)
TOTAL PROTEIN: 8.7 g/dL — AB (ref 6.5–8.1)

## 2016-05-30 LAB — CBC
HEMATOCRIT: 27.2 % — AB (ref 35.0–47.0)
Hemoglobin: 9 g/dL — ABNORMAL LOW (ref 12.0–16.0)
MCH: 28.5 pg (ref 26.0–34.0)
MCHC: 33 g/dL (ref 32.0–36.0)
MCV: 86.5 fL (ref 80.0–100.0)
Platelets: 208 10*3/uL (ref 150–440)
RBC: 3.14 MIL/uL — ABNORMAL LOW (ref 3.80–5.20)
RDW: 17.3 % — AB (ref 11.5–14.5)
WBC: 4 10*3/uL (ref 3.6–11.0)

## 2016-05-30 MED ORDER — ACETAMINOPHEN 325 MG PO TABS: 650.0000 mg | ORAL_TABLET | Freq: Four times a day (QID) | ORAL | Status: DC | PRN

## 2016-05-30 MED ORDER — ONDANSETRON HCL 4 MG/2ML IJ SOLN: 4.0000 mg | Freq: Four times a day (QID) | INTRAMUSCULAR | Status: DC | PRN

## 2016-05-30 MED ORDER — FUROSEMIDE 20 MG PO TABS
20.0000 mg | ORAL_TABLET | Freq: Every day | ORAL | Status: DC
  Administered 2016-05-31: 20 mg via ORAL
  Filled 2016-05-30 (×2): qty 1

## 2016-05-30 MED ORDER — ONDANSETRON HCL 4 MG PO TABS: 4.0000 mg | ORAL_TABLET | Freq: Four times a day (QID) | ORAL | Status: DC | PRN

## 2016-05-30 MED ORDER — DILTIAZEM HCL ER COATED BEADS 120 MG PO CP24
120.0000 mg | ORAL_CAPSULE | Freq: Every day | ORAL | Status: DC
  Administered 2016-05-31 (×2): 120 mg via ORAL
  Filled 2016-05-30 (×2): qty 1

## 2016-05-30 MED ORDER — METOPROLOL TARTRATE 25 MG PO TABS
12.5000 mg | ORAL_TABLET | Freq: Two times a day (BID) | ORAL | Status: DC
  Administered 2016-05-31 – 2016-06-01 (×4): 12.5 mg via ORAL
  Filled 2016-05-30 (×4): qty 1

## 2016-05-30 MED ORDER — RISPERIDONE 1 MG PO TABS
0.5000 mg | ORAL_TABLET | Freq: Two times a day (BID) | ORAL | Status: DC
  Administered 2016-05-31 – 2016-06-01 (×4): 0.5 mg via ORAL
  Filled 2016-05-30 (×4): qty 1

## 2016-05-30 MED ORDER — FAMOTIDINE IN NACL 20-0.9 MG/50ML-% IV SOLN
20.0000 mg | Freq: Every day | INTRAVENOUS | Status: DC
  Administered 2016-05-31 (×2): 20 mg via INTRAVENOUS
  Filled 2016-05-30 (×3): qty 50

## 2016-05-30 MED ORDER — LEVOTHYROXINE SODIUM 50 MCG PO TABS
50.0000 ug | ORAL_TABLET | Freq: Every day | ORAL | Status: DC
  Administered 2016-05-31 (×2): 50 ug via ORAL
  Filled 2016-05-30 (×2): qty 1

## 2016-05-30 MED ORDER — TRAZODONE HCL 50 MG PO TABS
50.0000 mg | ORAL_TABLET | Freq: Every day | ORAL | Status: DC
  Administered 2016-05-31 (×2): 50 mg via ORAL
  Filled 2016-05-30 (×2): qty 1

## 2016-05-30 MED ORDER — ACETAMINOPHEN 650 MG RE SUPP: 650.0000 mg | Freq: Four times a day (QID) | RECTAL | Status: DC | PRN

## 2016-05-30 NOTE — H&P (Signed)
PCP:   Madelyn Brunner, MD   Chief Complaint:  Melanotic stool  HPI: This is a 82 year old female who was recently admitted with the same complaint. She was monitored with serial H/H, She was transfused 2 units packed RBC and discharged home with no GI consult. Given patient's age, severe dementia, DO NOT RESUSCITATE status and comorbid issues, the family agreed with treatment plan. The family was on Eliquis, this was stopped and baby aspirin substituted which she takes daily. After discharge she had a single episode of blood in her stool, which self resolved. She followed up with her PCP, he gave her a prescription for iron. She was also referred to outpatient GI, she has an appointment on Tuesday. Today at approximately 6:15 PM she developed melanotic stools. She was in the bathroom on the commode and per her daughters she kept dripping melanotic blood for close to an hour. They finally brought her to the ER.  Review of Systems:  The patient denies anorexia, fever, weight loss,, vision loss, decreased hearing, hoarseness, chest pain, syncope, dyspnea on exertion, peripheral edema, balance deficits, hemoptysis, abdominal pain, melena, hematochezia, severe indigestion/heartburn, constipation, hematuria, incontinence, genital sores, muscle weakness, suspicious skin lesions, transient blindness, difficulty walking, depression,  weight loss over records approximately 20 pounds in 6 months], abnormal bleeding, enlarged lymph nodes, angioedema, and breast masses.  Past Medical History: Past Medical History  Diagnosis Date  . COPD (chronic obstructive pulmonary disease) ([place])   . Cancer ([place])   . Breast cancer ([place])   . CHF (congestive heart failure) ([place])   . Atrial fibrillation ([place])   . Hypertension   . Dementia    Past Surgical History  Procedure Laterality Date  . Abdominal hysterectomy    . Back surgery    . Mastectomy Left     Medications: Prior to Admission medications    Medication Sig Start Date End Date Taking? Authorizing Provider  diltiazem (DILACOR XR) 120 MG 24 hr capsule Take 1 capsule by mouth at bedtime.  07/31/15   Historical Provider, MD  docusate sodium (COLACE) 100 MG capsule Take 100 mg by mouth 2 (two) times daily.    Historical Provider, MD  furosemide (LASIX) 20 MG tablet Take 20 mg by mouth daily.    Historical Provider, MD  levothyroxine (SYNTHROID, LEVOTHROID) 50 MCG tablet Take 1 tablet by mouth at bedtime.  07/31/15   Historical Provider, MD  metoprolol tartrate (LOPRESSOR) 25 MG tablet Take 0.5 tablets (12.5 mg total) by mouth 2 (two) times daily. 04/24/16   Fritzi Mandes, MD  risperiDONE (RISPERDAL) 0.5 MG tablet Take 1 tablet by mouth 2 (two) times daily.  01/14/16 07/12/16  Historical Provider, MD  traZODone (DESYREL) 50 MG tablet Take 50 mg by mouth at bedtime.    Historical Provider, MD    Allergies:   Allergies  Allergen Reactions  . Budesonide-Formoterol Fumarate Other (See Comments)  . Donepezil Other (See Comments)  . Sulfa Antibiotics Other (See Comments)    Social History:  reports that she has quit smoking. She has never used smokeless tobacco. She reports that she does not drink alcohol. Her drug history is not on file.  Family History: History reviewed. No pertinent family history.  Physical Exam: Filed Vitals:   05/30/16 2018 05/30/16 2129  BP: 157/90 149/86  Pulse: 94 90  Resp: 18 18  Height: 5\' 7"  (1.702 m)   Weight: 61.236 kg (135 lb)   SpO2: 97% 97%    General:  Awake, pleasantly  demented female, well developed and nourished, no acute distress Eyes: PERRLA, pink conjunctiva, no scleral icterus ENT: Moist oral mucosa, neck supple, no thyromegaly Lungs: clear to ascultation, no wheeze, no crackles, no use of accessory muscles Cardiovascular: irregular rate and rhythm, no regurgitation, no gallops, no murmurs. No carotid bruits, no JVD Abdomen: soft, positive BS, non-tender, non-distended, no organomegaly, not an  acute abdomen GU: not examined Neuro: CN II - XII unable to assess properly due to patient's dementia Musculoskeletal: strength unable to assess properly due to patient's dementia, no clubbing, cyanosis or edema Skin: no rash, no subcutaneous crepitation, no decubitus Psych: Demented female   Labs on Admission:   Recent Labs  05/30/16 2028  NA 138  K 4.2  CL 104  CO2 29  GLUCOSE 102*  BUN 18  CREATININE 0.81  CALCIUM 9.8    Recent Labs  05/30/16 2028  AST 22  ALT 11*  ALKPHOS 112  BILITOT 0.4  PROT 8.7*  ALBUMIN 3.5   No results for input(s): LIPASE, AMYLASE in the last 72 hours.  Recent Labs  05/30/16 2028  WBC 4.0  HGB 9.0*  HCT 27.2*  MCV 86.5  PLT 208   No results for input(s): CKTOTAL, CKMB, CKMBINDEX, TROPONINI in the last 72 hours. Invalid input(s): POCBNP No results for input(s): DDIMER in the last 72 hours. No results for input(s): HGBA1C in the last 72 hours. No results for input(s): CHOL, HDL, LDLCALC, TRIG, CHOLHDL, LDLDIRECT in the last 72 hours. No results for input(s): TSH, T4TOTAL, T3FREE, THYROIDAB in the last 72 hours.  Invalid input(s): FREET3 No results for input(s): VITAMINB12, FOLATE, FERRITIN, TIBC, IRON, RETICCTPCT in the last 72 hours.  Micro Results: No results found for this or any previous visit (from the past 240 hour(s)).   Radiological Exams on Admission: No results found.  Assessment/Plan Present on Admission:  . GI bleed -Admit to MedSurg  -Serial H&H. Hemoglobin remains at baseline. -DC aspirin. Family states patient now on a baby aspirin daily substituted for Eliquis for treatment of A. Fib -IV Protonix twice daily. Nothing by mouth -Is H&H stable okay for discharge. If not transfuse. Patient has appointment with outpatient GI on Tuesday.  Atrial fibrillation -Rate control. DC aspirin  History of breast cancer -Aware  COPD  -Stable, aware  Hypertension -Stable, home medications resumed  Severe  dementia - Stable, home medications resumed   Marvel Mcphillips 05/30/2016, 10:12 PM

## 2016-05-30 NOTE — ED Notes (Signed)
Pt to ED from home via EMS with dark red stools that began this afternoon. [facility] with large GI Bleed requiring numerous blood transfusions. Per EMS pt baseline cognition, vitals stable at this time.

## 2016-05-30 NOTE — ED Provider Notes (Signed)
Time Seen: Approximately 2030  I have reviewed the triage notes  Chief Complaint: Rectal Bleeding   History of Present Illness: Haley Paul is a 82 y.o. female who presents via EMS from home. She is currently here with her son who is the primary historian. Patient apparently had numerous episodes of bloody stool which started this evening at approximately 6:30. Patient has a history of gastrointestinal bleeding requiring blood transfusion. The son states he is not aware of any obvious source and the patient was on Coumadin at that time for chronic atrial fibrillation. She is currently not on any significant blood thinner therapy at this time. The patient herself has history of dementia though can answer some questions appropriately and denies any discomfort.   Past Medical History  Diagnosis Date  . COPD (chronic obstructive pulmonary disease) ([place])   . Cancer ([place])   . Breast cancer ([place])   . CHF (congestive heart failure) ([place])   . Atrial fibrillation ([place])   . Hypertension   . Dementia     Patient Active Problem List   Diagnosis Date Noted  . Rectal bleeding 04/23/2016  . Anemia 04/23/2016  . COPD (chronic obstructive pulmonary disease) ([place]) 04/23/2016  . Breast cancer ([place]) 04/23/2016  . Atrial fibrillation ([place]) 04/23/2016  . Dementia 04/23/2016  . HTN (hypertension) 04/23/2016    Past Surgical History  Procedure Laterality Date  . Abdominal hysterectomy    . Back surgery    . Mastectomy Left     Past Surgical History  Procedure Laterality Date  . Abdominal hysterectomy    . Back surgery    . Mastectomy Left     Current Outpatient Rx  Name  Route  Sig  Dispense  Refill  . diltiazem (DILACOR XR) 120 MG 24 hr capsule   Oral   Take 1 capsule by mouth at bedtime.          . docusate sodium (COLACE) 100 MG capsule   Oral   Take 100 mg by mouth 2 (two) times daily.         . furosemide (LASIX) 20 MG tablet   Oral   Take 20 mg by mouth daily.          Marland Kitchen levothyroxine (SYNTHROID, LEVOTHROID) 50 MCG tablet   Oral   Take 1 tablet by mouth at bedtime.          . metoprolol tartrate (LOPRESSOR) 25 MG tablet   Oral   Take 0.5 tablets (12.5 mg total) by mouth 2 (two) times daily.   30 tablet   1   . risperiDONE (RISPERDAL) 0.5 MG tablet   Oral   Take 1 tablet by mouth 2 (two) times daily.          . traZODone (DESYREL) 50 MG tablet   Oral   Take 50 mg by mouth at bedtime.           Allergies:  Budesonide-formoterol fumarate; Donepezil; and Sulfa antibiotics  Family History: History reviewed. No pertinent family history.  Social History: Social History  Substance Use Topics  . Smoking status: Former Research scientist (life sciences)  . Smokeless tobacco: Never Used  . Alcohol Use: No     Review of Systems:   10 point review of systems was performed and was otherwise negative:  Constitutional: No fever Eyes: No visual disturbances ENT: No sore throat, ear pain Cardiac: No chest pain Respiratory: No shortness of breath, wheezing, or stridor Abdomen: No abdominal pain, no vomiting, No diarrhea  Endocrine: No weight loss, No night sweats Extremities: No peripheral edema, cyanosis Skin: No rashes, easy bruising Neurologic: No focal weakness, trouble with speech or swollowing Urologic: No dysuria, Hematuria, or urinary frequency   Physical Exam:  ED Triage Vitals  Enc Vitals Group     BP 05/30/16 2018 157/90 mmHg     Pulse Rate 05/30/16 2018 94     Resp 05/30/16 2018 18     Temp --      Temp src --      SpO2 05/30/16 2018 97 %     Weight 05/30/16 2018 135 lb (61.236 kg)     Height 05/30/16 2018 5\' 7"  (1.702 m)     Head Cir --      Peak Flow --      Pain Score --      Pain Loc --      Pain Edu? --      Excl. in [place]? --     General: Awake , Alert , and Oriented times 1, Glasgow Coma Scale 15 Head: Normal cephalic , atraumatic Eyes: Pupils equal , round, reactive to light Nose/Throat: No nasal drainage, patent upper  airway without erythema or exudate. Pill mucous membranes Neck: Supple, Full range of motion, No anterior adenopathy or palpable thyroid masses Lungs: Clear to ascultation without wheezes , rhonchi, or rales Heart: Regular rate, irregular rhythm without murmurs , gallops , or rubs Abdomen: Soft, non tender without rebound, guarding , or rigidity; bowel sounds positive and symmetric in all 4 quadrants. No organomegaly .        Extremities: 2 plus symmetric pulses. No edema, clubbing or cyanosis Neurologic: normal ambulation, Motor symmetric without deficits, sensory intact Skin: warm, dry, no rashes Rectal exam with chaperone present shows maroon colored stool in the rectal vault which is guaiac positive. No palpable masses, normal sphincter tone  Labs:   All laboratory work was reviewed including any pertinent negatives or positives listed below:  Labs Reviewed  COMPREHENSIVE METABOLIC PANEL  CBC  TYPE AND SCREEN  Patient's hemoglobin appears to be stable at 9.1 and she is hemodynamically stable and I felt transfusion was not necessary at this time.  EKG: * ED ECG REPORT I, Daymon Larsen, the attending physician, personally viewed and interpreted this ECG.  Date: 05/30/2016 EKG Time: 2020 Rate: *88 Rhythm: Atrial fibrillation QRS Axis: normal Intervals: Left bundle branch block pattern ST/T Wave abnormalities: normal Conduction Disturbances: none Narrative Interpretation: unremarkable Old lateral infarct No acute ischemic changes   ED Course:  Patient's been typed and screened and is currently stable at this time with new onset of a lower gastrointestinal bleed. Differential includes diverticulosis, AV malformation,: Rectal cancer which is unlikely. The patient's case was reviewed with the hospitalist team, further disposition and management depends upon her evaluation.    Assessment:  Lower gastrointestinal bleeding     Plan: * Inpatient  management           Daymon Larsen, MD 05/30/16 2126

## 2016-05-31 ENCOUNTER — Observation Stay: Payer: Medicare Other

## 2016-05-31 ENCOUNTER — Encounter: Payer: Self-pay | Admitting: Radiology

## 2016-05-31 DIAGNOSIS — K5791 Diverticulosis of intestine, part unspecified, without perforation or abscess with bleeding: Secondary | ICD-10-CM | POA: Diagnosis present

## 2016-05-31 DIAGNOSIS — Z882 Allergy status to sulfonamides status: Secondary | ICD-10-CM | POA: Diagnosis not present

## 2016-05-31 DIAGNOSIS — I482 Chronic atrial fibrillation: Secondary | ICD-10-CM | POA: Diagnosis present

## 2016-05-31 DIAGNOSIS — I509 Heart failure, unspecified: Secondary | ICD-10-CM | POA: Diagnosis present

## 2016-05-31 DIAGNOSIS — Z9012 Acquired absence of left breast and nipple: Secondary | ICD-10-CM | POA: Diagnosis not present

## 2016-05-31 DIAGNOSIS — J449 Chronic obstructive pulmonary disease, unspecified: Secondary | ICD-10-CM | POA: Diagnosis present

## 2016-05-31 DIAGNOSIS — K922 Gastrointestinal hemorrhage, unspecified: Secondary | ICD-10-CM | POA: Diagnosis present

## 2016-05-31 DIAGNOSIS — I110 Hypertensive heart disease with heart failure: Secondary | ICD-10-CM | POA: Diagnosis present

## 2016-05-31 DIAGNOSIS — E039 Hypothyroidism, unspecified: Secondary | ICD-10-CM | POA: Diagnosis present

## 2016-05-31 DIAGNOSIS — Z66 Do not resuscitate: Secondary | ICD-10-CM | POA: Diagnosis present

## 2016-05-31 DIAGNOSIS — Z87891 Personal history of nicotine dependence: Secondary | ICD-10-CM | POA: Diagnosis not present

## 2016-05-31 DIAGNOSIS — R1310 Dysphagia, unspecified: Secondary | ICD-10-CM | POA: Diagnosis present

## 2016-05-31 DIAGNOSIS — Z853 Personal history of malignant neoplasm of breast: Secondary | ICD-10-CM | POA: Diagnosis not present

## 2016-05-31 DIAGNOSIS — Z79899 Other long term (current) drug therapy: Secondary | ICD-10-CM | POA: Diagnosis not present

## 2016-05-31 DIAGNOSIS — Z888 Allergy status to other drugs, medicaments and biological substances status: Secondary | ICD-10-CM | POA: Diagnosis not present

## 2016-05-31 DIAGNOSIS — K625 Hemorrhage of anus and rectum: Secondary | ICD-10-CM | POA: Diagnosis present

## 2016-05-31 DIAGNOSIS — F0390 Unspecified dementia without behavioral disturbance: Secondary | ICD-10-CM | POA: Diagnosis present

## 2016-05-31 DIAGNOSIS — Z7982 Long term (current) use of aspirin: Secondary | ICD-10-CM | POA: Diagnosis not present

## 2016-05-31 LAB — HEMOGLOBIN AND HEMATOCRIT, BLOOD
HCT: 24.7 % — ABNORMAL LOW (ref 35.0–47.0)
HEMATOCRIT: 24 % — AB (ref 35.0–47.0)
HEMATOCRIT: 24.9 % — AB (ref 35.0–47.0)
HEMATOCRIT: 28.1 % — AB (ref 35.0–47.0)
HEMOGLOBIN: 8 g/dL — AB (ref 12.0–16.0)
Hemoglobin: 8.3 g/dL — ABNORMAL LOW (ref 12.0–16.0)
Hemoglobin: 8.3 g/dL — ABNORMAL LOW (ref 12.0–16.0)
Hemoglobin: 9.3 g/dL — ABNORMAL LOW (ref 12.0–16.0)

## 2016-05-31 LAB — BASIC METABOLIC PANEL
ANION GAP: 3 — AB (ref 5–15)
BUN: 21 mg/dL — ABNORMAL HIGH (ref 6–20)
CALCIUM: 9.3 mg/dL (ref 8.9–10.3)
CO2: 28 mmol/L (ref 22–32)
Chloride: 107 mmol/L (ref 101–111)
Creatinine, Ser: 0.79 mg/dL (ref 0.44–1.00)
GFR calc Af Amer: >60 mL/min (ref >60)
Glucose, Bld: 99 mg/dL (ref 65–99)
POTASSIUM: 4.2 mmol/L (ref 3.5–5.1)
SODIUM: 138 mmol/L (ref 135–145)

## 2016-05-31 LAB — PREPARE RBC (CROSSMATCH)

## 2016-05-31 MED ORDER — LORAZEPAM 2 MG/ML IJ SOLN
0.5000 mg | Freq: Once | INTRAMUSCULAR | Status: AC | PRN
  Administered 2016-05-31: 0.5 mg via INTRAVENOUS
  Filled 2016-05-31: qty 1

## 2016-05-31 MED ORDER — LORAZEPAM 2 MG/ML IJ SOLN
0.2500 mg | Freq: Once | INTRAMUSCULAR | Status: AC
  Administered 2016-05-31: 0.25 mg via INTRAVENOUS
  Filled 2016-05-31: qty 1

## 2016-05-31 MED ORDER — TECHNETIUM TC 99M-LABELED RED BLOOD CELLS IV KIT
20.0000 | PACK | Freq: Once | INTRAVENOUS | Status: AC | PRN
  Administered 2016-05-31: 21.345 via INTRAVENOUS

## 2016-05-31 MED ORDER — SODIUM CHLORIDE 0.9 % IV SOLN
Freq: Once | INTRAVENOUS | Status: AC
  Administered 2016-05-31: 15:00:00 via INTRAVENOUS

## 2016-05-31 NOTE — Consult Note (Signed)
GI Inpatient Consult Note  Reason for Consult: GI bleed   Attending Requesting Consult: Dr. Benjie Karvonen  History of Present Illness: Haley Paul is a 82 y.o. female with a known history of severe dementia, A. fib (previously on Coumadin, not on ASA 81 mg due to GI bleed), CHF, COPD, breast cancer, and HTN admitted with a GI bleed.  Patient was admitted at [facility] from 5/30-04/25/16 also for the same; patient's children observed copious amounts of BRB "clots" in her chair when helping patient ambulate to the bathroom.  She was transfused with 2 units PRBCs, and serial H&H was monitored.  Patient is a DNR, so the only intervention performed was discontinuing Coumadin and utilizing ASA 81 mg instead.  Upon discharge, patient's children report she had one episode of BRB in stool, but none further.  Patient followed up with her PCP, who provided a prescription for oral iron and referred her to an outpatient GI.  However, patient's daughters reported "liquid black stool" dripping from patient's rectum while she sat on the commode; this continued for about 1 hour.  Patient was brought to the ED for further evaluation.  In the ED, patient and her son denied additional GI complaints, aside from melanotic stool.  Rectal exam revealed melanotic stool in the rectal vault which was heme positive.  Labs were notable for Hgb 9.0, which is higher patient's Hgb of 7.2 at discharge last month.  Patient was admitted for further evaluation, and GI consultation was requested.  Today, patient's son and daughter-in-law are present at the bedside to provide the history.  They state she appears to be in her usual state of health and functional status.  Her stools have began to lighten in color since no iron was given today; they plan to not resume oral iron going forward.  However, there is still a small amount of BRB in patient's undergarments when changed.  No behavior changes to suggest other GI concerns like GERD, abdominal pain  or nausea/vomiting.  Patient has difficulty swallowing at baseline, which is unchanged.     Past Medical History:  Past Medical History  Diagnosis Date  . COPD (chronic obstructive pulmonary disease) ([place])   . Cancer ([place])   . Breast cancer ([place])   . CHF (congestive heart failure) ([place])   . Atrial fibrillation ([place])   . Hypertension   . Dementia     Problem List: Patient Active Problem List   Diagnosis Date Noted  . GI bleed 05/30/2016  . Rectal bleeding 04/23/2016  . Anemia 04/23/2016  . COPD (chronic obstructive pulmonary disease) ([place]) 04/23/2016  . Breast cancer ([place]) 04/23/2016  . Atrial fibrillation ([place]) 04/23/2016  . Dementia 04/23/2016  . HTN (hypertension) 04/23/2016    Past Surgical History: Past Surgical History  Procedure Laterality Date  . Abdominal hysterectomy    . Back surgery    . Mastectomy Left     Allergies: Allergies  Allergen Reactions  . Budesonide-Formoterol Fumarate Other (See Comments)  . Donepezil Other (See Comments)  . Sulfa Antibiotics Other (See Comments)    Home Medications: Prescriptions prior to admission  Medication Sig Dispense Refill Last Dose  . acetaminophen (TYLENOL) 500 MG tablet Take 1,000 mg by mouth 2 (two) times daily.   05/30/2016 at Unknown time  . diltiazem (DILACOR XR) 120 MG 24 hr capsule Take 1 capsule by mouth at bedtime.    05/29/2016 at Unknown time  . docusate sodium (COLACE) 100 MG capsule Take 100 mg by mouth 2 (  two) times daily.   05/30/2016 at Unknown time  . furosemide (LASIX) 20 MG tablet Take 20 mg by mouth daily.   05/30/2016 at Unknown time  . levothyroxine (SYNTHROID, LEVOTHROID) 50 MCG tablet Take 1 tablet by mouth at bedtime.    05/29/2016 at Unknown time  . metoprolol tartrate (LOPRESSOR) 25 MG tablet Take 0.5 tablets (12.5 mg total) by mouth 2 (two) times daily. 30 tablet 1 05/30/2016 at 0800  . risperiDONE (RISPERDAL) 0.5 MG tablet Take 1 tablet by mouth 2 (two) times daily.    05/30/2016 at Unknown time  .  traZODone (DESYREL) 50 MG tablet Take 50 mg by mouth at bedtime.   05/29/2016 at Unknown time   Home medication reconciliation was completed with the patient.   Scheduled Inpatient Medications:   . diltiazem  120 mg Oral QHS  . famotidine (PEPCID) IV  20 mg Intravenous QHS  . furosemide  20 mg Oral Daily  . levothyroxine  50 mcg Oral QHS  . metoprolol tartrate  12.5 mg Oral BID  . risperiDONE  0.5 mg Oral BID  . traZODone  50 mg Oral QHS    Continuous Inpatient Infusions:     PRN Inpatient Medications:  acetaminophen **OR** acetaminophen, ondansetron **OR** ondansetron (ZOFRAN) IV  Family History: family history is not on file.    Social History:   reports that she has quit smoking. She has never used smokeless tobacco. She reports that she does not drink alcohol.   Review of Systems: Unable to obtain due to severe dementia.   Physical Examination: BP 135/70 mmHg  Pulse 67  Temp(Src) 97.6 F (36.4 C) (Oral)  Resp 16  Ht 5\' 7"  (1.702 m)  Wt 57.97 kg (127 lb 12.8 oz)  BMI 20.01 kg/m2  SpO2 98% Gen: NAD, alert, pleasantly demented HEENT: PEERLA, EOMI, Neck: supple, no JVD or thyromegaly Chest: CTA bilaterally, no wheezes, crackles, or other adventitious sounds CV: RRR, no m/g/c/r Abd: soft, NT, ND, +BS in all four quadrants; no HSM, guarding, ridigity, or rebound tenderness Rectal: Gross BRB intermixed with dark, soft stool in patient's undergarments; no gross rectal abnormalities noted Ext: no edema, well perfused with 2+ pulses, Skin: no rash or lesions noted Lymph: no LAD  Data: Lab Results  Component Value Date   WBC 4.0 05/30/2016   HGB 8.3* 05/31/2016   HCT 24.7* 05/31/2016   MCV 86.5 05/30/2016   PLT 208 05/30/2016    Recent Labs Lab 05/30/16 2028 05/31/16 0040 05/31/16 0757  HGB 9.0* 8.3* 8.3*   Lab Results  Component Value Date   NA 138 05/31/2016   K 4.2 05/31/2016   CL 107 05/31/2016   CO2 28 05/31/2016   BUN 21* 05/31/2016   CREATININE  0.79 05/31/2016   Lab Results  Component Value Date   ALT 11* 05/30/2016   AST 22 05/30/2016   ALKPHOS 112 05/30/2016   BILITOT 0.4 05/30/2016   No results for input(s): APTT, INR, PTT in the last 168 hours.   Assessment/Plan: Ms. Wierman is a 82 y.o. female with a known history of severe dementia, A. fib (previously on Coumadin, not on ASA 81mg  or iron since last night due to GI bleed), CHF, COPD, breast cancer, and HTN admitted with a GI bleed. This morning, Hgb is stable at 8.3.  I spent about 15 minutes with patient's son and daughter-in-law, who are patient's primary caregivers.  I also was present while patient's nurse changed her depends, which had dark brown stool with  a small amount of BRB intermixed. Patient's children again reiterated that they want to keep Ms. Junio comfortable as possible without invasive interventions.  Bleeding may be hemorrhoidal in nature, but unlikely a significant upper or lower GI origin given patient's stability.  I discussed this patient with Dr. Vira Agar, and watchful waiting is recommended at this time. Please see Dr. Percell Boston note for additional recommendations.  Recommendations: - Orders placed for clear liquid diet - Monitor Hgb, transfuse <7 - Will continue to follow  Thank you for the consult. We will follow along with you. Please call with questions or concerns.  Lavera Guise, PA-C [facility] Phone: [phone] Pager: [phone]

## 2016-05-31 NOTE — Care Management Obs Status (Signed)
MEDICARE OBSERVATION STATUS NOTIFICATION   Patient Details  Name: Haley Paul MRN: [id number] Date of Birth: 04/25/1920   Medicare Observation Status Notification Given:  No (severe dementia / GI Bleed)    Ival Bible, RN 05/31/2016, 4:36 PM

## 2016-05-31 NOTE — Progress Notes (Signed)
Dr. Edd Fabian PA at bedside. Family updated.

## 2016-05-31 NOTE — Progress Notes (Signed)
Patient family called to check on patient this evening. Called several times line states that call is unable to be completed as dialed. Rechecked number in navigators and the number I called is correct

## 2016-05-31 NOTE — Plan of Care (Signed)
Problem: Fluid Volume: Goal: Will show no signs and symptoms of excessive bleeding Outcome: Not Met (add Reason) Continued bloody stools.   Problem: Fluid Volume: Goal: Ability to maintain a balanced intake and output will improve Outcome: Not Met (add Reason) Pt is NPO  Problem: Nutrition: Goal: Adequate nutrition will be maintained Outcome: Not Met (add Reason) Pt is NPO

## 2016-05-31 NOTE — Progress Notes (Signed)
Patient will need something to calm her down before going to bleeding scan. Dr Benjie Karvonen ordered ativan

## 2016-05-31 NOTE — Progress Notes (Addendum)
Blood transfusion completed at 1723. Pt in Nuclear Medicine. Blood discontinued and line flushed. VS stable. Pt will return to floor after procedure.

## 2016-05-31 NOTE — Progress Notes (Signed)
Lab requests callback when pt procedure is completed. Need post-transfusion H&H drawn.

## 2016-05-31 NOTE — Progress Notes (Addendum)
Pt left floor for procedure. Blood product continued at 122ml/hr. No s/s of reaction at this time. NA sent with pt for monitoring/safety.

## 2016-05-31 NOTE — Consult Note (Signed)
Patient had a large amount of blood per rectum per her nurse.  I have spoken to her son and his wife about the seriousness of this.  Will get GI bleeding scan and if positive ask vascular surgery to do arteriogram. Discussed with her nurse.  I discussed the age of the patient makes sudden death more likely.  I do not think a colonoscopy would be appropriate given her age of 11.

## 2016-05-31 NOTE — Progress Notes (Signed)
Pt had 3 bloody stools at this time, latest hemoglobin level 8.3. Prime doc Pyreddy made aware. No new orders made. Will continue to monitor.

## 2016-05-31 NOTE — Progress Notes (Signed)
0.5 mg ativan given IV at 1650 for agitation/restlessness during procedure.

## 2016-05-31 NOTE — Progress Notes (Signed)
Per family, pt is taking her oral medicines crushed in ice cream. Pt's diet order is NPO at this time, however there are due oral pills to be given. Prime doc [facility] paged and made aware, ordered for NPO except for meds crushed in applesauce or ice cream. Will administer due meds and continue to monitor.

## 2016-05-31 NOTE — Progress Notes (Signed)
Haley Paul at [place] NAME: Haley Paul    MR#:  [id number]  DATE OF BIRTH:  10-09-1920  SUBJECTIVE:   Family bedside. Patient continue to have a few more bloody bowel movements. They report that her bowel movements are actually very dark-colored.  REVIEW OF SYSTEMS:    Review of Systems  Unable to perform ROS: dementia    Tolerating Diet: Nothing by mouth      DRUG ALLERGIES:   Allergies  Allergen Reactions  . Budesonide-Formoterol Fumarate Other (See Comments)  . Donepezil Other (See Comments)  . Sulfa Antibiotics Other (See Comments)    VITALS:  Blood pressure 135/70, pulse 67, temperature 97.6 F (36.4 C), temperature source Oral, resp. rate 16, height 5\' 7"  (1.702 m), weight 57.97 kg (127 lb 12.8 oz), SpO2 98 %.  PHYSICAL EXAMINATION:   Physical Exam  Constitutional: She is well-developed, well-nourished, and in no distress. No distress.  HENT:  Head: Normocephalic.  Eyes: No scleral icterus.  Neck: Neck supple. No JVD present. No tracheal deviation present. No thyromegaly present.  Cardiovascular: Normal rate, regular rhythm and normal heart sounds.  Exam reveals no gallop and no friction rub.   No murmur heard. Pulmonary/Chest: Effort normal and breath sounds normal. No respiratory distress. She has no wheezes. She has no rales. She exhibits no tenderness.  Abdominal: Soft. Bowel sounds are normal. She exhibits no distension and no mass. There is no tenderness. There is no rebound and no guarding.  Musculoskeletal: Normal range of motion. She exhibits no edema.  Neurological: She is alert.  Skin: Skin is warm. No rash noted. No erythema.      LABORATORY PANEL:   CBC  Recent Labs Lab 05/30/16 2028  05/31/16 0757  WBC 4.0  --   --   HGB 9.0*  < > 8.3*  HCT 27.2*  < > 24.7*  PLT 208  --   --   < > = values in this interval not  displayed. ------------------------------------------------------------------------------------------------------------------  Chemistries   Recent Labs Lab 05/30/16 2028 05/31/16 0757  NA 138 138  K 4.2 4.2  CL 104 107  CO2 29 28  GLUCOSE 102* 99  BUN 18 21*  CREATININE 0.81 0.79  CALCIUM 9.8 9.3  AST 22  --   ALT 11*  --   ALKPHOS 112  --   BILITOT 0.4  --    ------------------------------------------------------------------------------------------------------------------  Cardiac Enzymes No results for input(s): TROPONINI in the last 168 hours. ------------------------------------------------------------------------------------------------------------------  RADIOLOGY:  No results found.   ASSESSMENT AND PLAN:    82 year old female with a history of dementia who was admitted in May for GI bleed status post 3 units of PRBCs who presents with bloody stools.  1. GI bleed: This is likely lower GI bleed. Hemoglobin has slightly dropped but no indication for blood transfusion at this time. Aspirin has been discontinued. Continue to monitor hemoglobin. Discussed case with GI. Hemoglobin stable at 8.3 2. Atrial fibrillation: Patient is currently in normal sinus rhythm: Aspirin has been discontinued Continue metoprolol and diltiazem   3. Dementia: Monitor for delirium. Continue Risperdal 4. Hypothyroid: Continue Synthroid Management plans discussed with the patient's family and  they are in agreement.  CODE STATUS: DNR  TOTAL TIME TAKING CARE OF THIS PATIENT: 30 minutes.   D/w dr Haley Paul  POSSIBLE D/C tomorrow, DEPENDING ON CLINICAL CONDITION.   Haley Paul M.D on 05/31/2016 at 10:18 AM  Between 7am to 6pm - Pager - [phone]  After 6pm go to [email or web address] - password [email or web address] [place] [facility]  Office  [phone]  CC: Primary care physician; Haley Brunner, MD  Note: This dictation was prepared with Dragon dictation along with smaller  phrase technology. Any transcriptional errors that result from this process are unintentional.

## 2016-05-31 NOTE — Progress Notes (Signed)
New Admission Note:   Arrival Method: per bed from ED, pt came from home  Mental Orientation: alert and oriented to self Telemetry: none ordered Assessment: Completed Skin: warm, dry, with excoriation noted on the sacrum and buttocks. Sacral foam dressing initiated. IV: G18 on the left forearm with transparent dressing, intact Pain: denies any pain as of this time Safety Measures: Safety Fall Prevention Plan has been given and discussed with family Admission: Completed Family: children at bedside  Orders have been reviewed and implemented. Will continue to monitor the patient. Call light has been placed within reach and bed alarm has been activated.   Georgeanna Harrison BSN, RN [facility]

## 2016-06-01 LAB — TYPE AND SCREEN
ABO/RH(D): O NEG
ANTIBODY SCREEN: NEGATIVE
Unit division: 0

## 2016-06-01 LAB — HEMOGLOBIN AND HEMATOCRIT, BLOOD
HEMATOCRIT: 26.2 % — AB (ref 35.0–47.0)
HEMATOCRIT: 27.1 % — AB (ref 35.0–47.0)
Hemoglobin: 8.9 g/dL — ABNORMAL LOW (ref 12.0–16.0)
Hemoglobin: 9.3 g/dL — ABNORMAL LOW (ref 12.0–16.0)

## 2016-06-01 MED ORDER — FUROSEMIDE 20 MG PO TABS: 20.0000 mg | ORAL_TABLET | ORAL | Status: AC

## 2016-06-01 NOTE — Evaluation (Signed)
Physical Therapy Evaluation Patient Details Name: Haley Paul MRN: [id number] DOB: January 09, 1920 Today's Date: 06/01/2016   History of Present Illness  82 yo female with onset of GI bleed at home was admitted for testing and found not to be actively bleeding.  Received one unit after two at last admission.  PMHx:  breast CA, CHF, a-fib, HTN, dementia, back surgery, COPD  Clinical Impression  Pt was assessed on her way out to home by PT, who determined pt needs RW and HHPT for that setting.  She is truly at SNF level but is scheduled for home and talked with daughter to ask her to call primary care doctor for follow up needs if pt is feeling bad and unable to do much walking at home.  PT will not plan follow up care as she is moving to another care setting now.      Follow Up Recommendations SNF    Equipment Recommendations  Rolling walker with 5" wheels    Recommendations for Other Services Rehab consult     Precautions / Restrictions Precautions Precautions: Fall Restrictions Weight Bearing Restrictions: No      Mobility  Bed Mobility               General bed mobility comments: up when PT entered  Transfers Overall transfer level: Needs assistance Equipment used: Rolling walker (2 wheeled);1 person hand held assist Transfers: Sit to/from [facility] Sit to Stand: Mod assist Stand pivot transfers: Mod assist       General transfer comment: assisted RLE to move to pivot to chair and slid hips to position on seat  Ambulation/Gait             General Gait Details: too fatigued to take steps but walked with nursing prior to PT arrival  Stairs            Wheelchair Mobility    Modified Rankin (Stroke Patients Only)       Balance                                             Pertinent Vitals/Pain Pain Assessment:  (not assessed)    Home Living Family/patient expects to be discharged to:: Private  residence Living Arrangements: Alone Available Help at Discharge: Family;Personal care attendant Type of Home: House Home Access: Stairs to enter Entrance Stairs-Rails: None Entrance Stairs-Number of Steps: 1 Home Layout: One level Home Equipment: Environmental consultant - 4 wheels;Bedside commode;Shower seat;Wheelchair - manual      Prior Function Level of Independence: Needs assistance   Gait / Transfers Assistance Needed: previously used attendant and family to assist mobility  ADL's / Homemaking Assistance Needed: Pt requires assist with ADLs/IADLs        Hand Dominance        Extremity/Trunk Assessment   Upper Extremity Assessment: Generalized weakness           Lower Extremity Assessment: Generalized weakness      Cervical / Trunk Assessment: Kyphotic  Communication   Communication: HOH  Cognition Arousal/Alertness: Lethargic Behavior During Therapy: Flat affect Overall Cognitive Status: Difficult to assess                      General Comments General comments (skin integrity, edema, etc.): Family was imminently leaving when PT arrived and will be going home with HHPT and RW  requested.  Daughter who is caregiver was repeatedly advised to call her primary care if pt is not able to succeed at home due to weakness.      Exercises        Assessment/Plan    PT Assessment Patient needs continued PT services  PT Diagnosis Difficulty walking;Generalized weakness;Altered mental status   PT Problem List Decreased strength;Decreased range of motion;Decreased activity tolerance;Decreased balance;Decreased mobility;Decreased coordination;Decreased cognition;Decreased knowledge of use of DME;Decreased safety awareness;Decreased knowledge of precautions;Cardiopulmonary status limiting activity;Decreased skin integrity  PT Treatment Interventions Other (comment) (leaving for home)   PT Goals (Current goals can be found in the Care Plan section) Acute Rehab PT Goals Patient  Stated Goal: none stated PT Goal Formulation:  (pt leaving as PT arrived)    Frequency Other (Comment)   Barriers to discharge Inaccessible home environment;Decreased caregiver support (home alone previously at times)      Co-evaluation               End of Session Equipment Utilized During Treatment: Gait belt Activity Tolerance: Patient limited by fatigue;Patient limited by lethargy;Other (comment) (unable to assess LE'sdue to fatigue) Patient left:  (wheelchair for transport to car) Nurse Communication: Mobility status;Other (comment) (home services and equipment and to nurse case manager)         Time: [id number] PT Time Calculation (min) (ACUTE ONLY): 25 min   Charges:   PT Evaluation $PT Eval Moderate Complexity: 1 Procedure PT Treatments $Therapeutic Activity: 8-22 mins   PT G Codes:        Ramond Dial Jun 27, 2016, 12:08 PM    Mee Hives, PT MS Acute Rehab Dept. Number: [place]

## 2016-06-01 NOTE — Discharge Summary (Signed)
[facility] NAME: Haley Paul    MR#:  [id number]  DATE OF BIRTH:  Mar 22, 1920  DATE OF ADMISSION:  05/30/2016 ADMITTING PHYSICIAN: Quintella Baton, MD  DATE OF DISCHARGE: *06/01/2016 PRIMARY CARE PHYSICIAN: Madelyn Brunner, MD    ADMISSION DIAGNOSIS:  Lower GI bleed [K92.2]  DISCHARGE DIAGNOSIS:  Active Problems:   GI bleed   GIB (gastrointestinal bleeding)   SECONDARY DIAGNOSIS:   Past Medical History  Diagnosis Date  . COPD (chronic obstructive pulmonary disease) ([place])   . Cancer ([place])   . Breast cancer ([place])   . CHF (congestive heart failure) ([place])   . Atrial fibrillation ([place])   . Hypertension   . Dementia     HOSPITAL COURSE:   82 year old female with dementia presents with lower GI bleed.  1. Lower GI bleed: Patient most likely has a diverticular bleed. GI bleeding scan was negative. Patient received 1 unit of PRBCs while in the hospital. Patient was evaluated by GI consultant. Hemoglobin has now remained stable. Family is aware the patient may have ongoing issues with GI bleed. Hemodynamically patient was stable.  2. Atrial fibrillation: Patient is currently in normal sinus rhythm: Aspirin has been discontinued due to GI bleed. Continue metoprolol and diltiazem   3. Dementia:  Continue Risperdal  4. Hypothyroid: Continue Synthroid        DISCHARGE CONDITIONS AND DIET:  Stable On dysphagia diet with aspiration precautions  CONSULTS OBTAINED:  Treatment Team:  Manya Silvas, MD  DRUG ALLERGIES:   Allergies  Allergen Reactions  . Budesonide-Formoterol Fumarate Other (See Comments)  . Donepezil Other (See Comments)  . Sulfa Antibiotics Other (See Comments)    DISCHARGE MEDICATIONS:   Current Discharge Medication List    CONTINUE these medications which have CHANGED   Details  furosemide (LASIX) 20 MG tablet Take 1 tablet (20 mg total) by mouth every other day. Qty: 30 tablet,  Refills: 0      CONTINUE these medications which have NOT CHANGED   Details  acetaminophen (TYLENOL) 500 MG tablet Take 1,000 mg by mouth 2 (two) times daily.    diltiazem (DILACOR XR) 120 MG 24 hr capsule Take 1 capsule by mouth at bedtime.     docusate sodium (COLACE) 100 MG capsule Take 100 mg by mouth 2 (two) times daily.    levothyroxine (SYNTHROID, LEVOTHROID) 50 MCG tablet Take 1 tablet by mouth at bedtime.     metoprolol tartrate (LOPRESSOR) 25 MG tablet Take 0.5 tablets (12.5 mg total) by mouth 2 (two) times daily. Qty: 30 tablet, Refills: 1    risperiDONE (RISPERDAL) 0.5 MG tablet Take 1 tablet by mouth 2 (two) times daily.     traZODone (DESYREL) 50 MG tablet Take 50 mg by mouth at bedtime.              Today   CHIEF COMPLAINT:  No acute events overnight. Patient had 2 dark colored stools. No bloody stools   VITAL SIGNS:  Blood pressure 141/74, pulse 92, temperature 97.4 F (36.3 C), temperature source Other (Comment), resp. rate 16, height 5\' 7"  (1.702 m), weight 57.97 kg (127 lb 12.8 oz), SpO2 96 %.   REVIEW OF SYSTEMS:  Review of Systems  Unable to perform ROS: dementia     PHYSICAL EXAMINATION:  GENERAL:  82 y.o.-year-old patient lying in the bed with no acute distress.  NECK:  Supple, no jugular venous distention. No thyroid enlargement, no tenderness.  LUNGS: Normal breath sounds bilaterally, no wheezing, rales,rhonchi  No use of accessory muscles of respiration.  CARDIOVASCULAR: S1, S2 normal. No murmurs, rubs, or gallops.  ABDOMEN: Soft, non-tender, non-distended. Bowel sounds present. No organomegaly or mass.  EXTREMITIES: No pedal edema, cyanosis, or clubbing.  PSYCHIATRIC: The patient is alert and oriented x name SKIN: No obvious rash, lesion, or ulcer.   DATA REVIEW:   CBC  Recent Labs Lab 05/30/16 2028  06/01/16 0754  WBC 4.0  --   --   HGB 9.0*  < > 9.3*  HCT 27.2*  < > 27.1*  PLT 208  --   --   < > = values in this  interval not displayed.  Chemistries   Recent Labs Lab 05/30/16 2028 05/31/16 0757  NA 138 138  K 4.2 4.2  CL 104 107  CO2 29 28  GLUCOSE 102* 99  BUN 18 21*  CREATININE 0.81 0.79  CALCIUM 9.8 9.3  AST 22  --   ALT 11*  --   ALKPHOS 112  --   BILITOT 0.4  --     Cardiac Enzymes No results for input(s): TROPONINI in the last 168 hours.  Microbiology Results  @MICRORSLT48 @  RADIOLOGY:  Nm Gi Blood Loss  05/31/2016  CLINICAL DATA:  Bloody stools. EXAM: NUCLEAR MEDICINE GASTROINTESTINAL BLEEDING SCAN TECHNIQUE: Sequential abdominal images were obtained following intravenous administration of Tc-15m labeled red blood cells. RADIOPHARMACEUTICALS:  21.345 mCi Tc-62m in-vitro labeled red cells. COMPARISON:  None. FINDINGS: No source of the patient's GI bleed is identified. IMPRESSION: Negative GI bleed. Electronically Signed   By: Dorise Bullion III M.D   On: 05/31/2016 19:03      Management plans discussed with the patient's daughter and she is in agreement. Stable for discharge home health care with hospice services Discussed with Dr. Vira Agar Patient should follow up with PCP  CODE STATUS:     Code Status Orders        Start     Ordered   05/30/16 2352  Do not attempt resuscitation (DNR)   Continuous    Question Answer Comment  In the event of cardiac or respiratory ARREST Do not call a "code blue"   In the event of cardiac or respiratory ARREST Do not perform Intubation, CPR, defibrillation or ACLS   In the event of cardiac or respiratory ARREST Use medication by any route, position, wound care, and other measures to relive pain and suffering. May use oxygen, suction and manual treatment of airway obstruction as needed for comfort.      05/30/16 2351    Code Status History    Date Active Date Inactive Code Status Order ID Comments User Context   04/23/2016  3:03 AM 04/25/2016  3:12 PM DNR [id number]  Quintella Baton, MD Inpatient    Advance Directive Documentation         Most Recent Value   Type of Advance Directive  Out of facility DNR (pink MOST or yellow form)   Pre-existing out of facility DNR order (yellow form or pink MOST form)  Yellow form placed in chart (order not valid for inpatient use)   "MOST" Form in Place?        TOTAL TIME TAKING CARE OF THIS PATIENT: 38 minutes.    Note: This dictation was prepared with Dragon dictation along with smaller phrase technology. Any transcriptional errors that result from this process are unintentional.  Gussie Murton M.D on 06/01/2016 at 9:24 AM  Between 7am  to 6pm - Pager - [phone] After 6pm go to [email or web address] - password [email or web address] [facility]  Office  [phone]  CC: Primary care physician; Madelyn Brunner, MD

## 2016-06-01 NOTE — Progress Notes (Signed)
Patient was able to get OOB into chair and walk to the door with assist x2 and [place].  Family was in room during activity. Family stated that they were satisfied taking her home. PT came into room after this and stated that the patient was "wiped out" from the walk.  Case worker notified this RN that home health pt and nursing would resume  for patient. IVs removed with cath intact.  Discussed with family about the level of help the patient needs and family was ok with the situation and understands.  They have the equipment and sitters that come into the house daily.  This RN and Andee Poles, CNA wheeled patient out the the vehicle.  It took a strong assist of 2 to get patient into the vehicle.  Again, discussed with family how they were going to be able to take patient home and get patient out of the vehicle.  Family again stated that they have help and that her sister-in-law is a physical therapist, if they needed additional assistance. This RN instructed family to utilize the phone numbers on the discharge packet if there's any needs they can not meet for the patient.

## 2016-06-01 NOTE — Care Management Note (Deleted)
Case Management Note  Patient Details  Name: Haley Paul MRN: [id number] Date of Birth: 1920-05-23  Subjective/Objective:                    Action/Plan:   Expected Discharge Date:                  Expected Discharge Plan:     In-House Referral:     Discharge planning Services     Post Acute Care Choice:    Choice offered to:     DME Arranged:    DME Agency:     HH Arranged:    HH Agency:     Status of Service:     If discussed at [employer] of Stay Meetings, dates discussed:    Additional Comments:  [name] A, RN 06/01/2016, 11:41 AM

## 2016-06-01 NOTE — Care Management Note (Addendum)
Case Management Note  Patient Details  Name: Haley Paul MRN: [id number] Date of Birth: Jan 20, 1920  Subjective/Objective:    A referral was called to Jana Half at [facility] to resume home health PT, RN, Aide and SW.                 Action/Plan:   Expected Discharge Date:                  Expected Discharge Plan:     In-House Referral:     Discharge planning Services     Post Acute Care Choice:    Choice offered to:     DME Arranged:    DME Agency:     HH Arranged:    HH Agency:     Status of Service:     If discussed at [employer] of Stay Meetings, dates discussed:    Additional Comments:  Trena Dunavan A, RN 06/01/2016, 9:41 AM

## 2016-06-17 ENCOUNTER — Inpatient Hospital Stay
Admission: EM | Admit: 2016-06-17 | Discharge: 2016-06-24 | DRG: 884 | Disposition: E | Payer: Medicare Other | Attending: Internal Medicine | Admitting: Internal Medicine

## 2016-06-17 ENCOUNTER — Emergency Department: Payer: Medicare Other

## 2016-06-17 DIAGNOSIS — I4891 Unspecified atrial fibrillation: Secondary | ICD-10-CM | POA: Diagnosis present

## 2016-06-17 DIAGNOSIS — Z820 Family history of epilepsy and other diseases of the nervous system: Secondary | ICD-10-CM | POA: Diagnosis not present

## 2016-06-17 DIAGNOSIS — Z66 Do not resuscitate: Secondary | ICD-10-CM | POA: Diagnosis present

## 2016-06-17 DIAGNOSIS — Z853 Personal history of malignant neoplasm of breast: Secondary | ICD-10-CM | POA: Diagnosis not present

## 2016-06-17 DIAGNOSIS — Z90710 Acquired absence of both cervix and uterus: Secondary | ICD-10-CM

## 2016-06-17 DIAGNOSIS — R410 Disorientation, unspecified: Secondary | ICD-10-CM | POA: Diagnosis not present

## 2016-06-17 DIAGNOSIS — Z87891 Personal history of nicotine dependence: Secondary | ICD-10-CM

## 2016-06-17 DIAGNOSIS — I63532 Cerebral infarction due to unspecified occlusion or stenosis of left posterior cerebral artery: Secondary | ICD-10-CM | POA: Diagnosis present

## 2016-06-17 DIAGNOSIS — Z882 Allergy status to sulfonamides status: Secondary | ICD-10-CM

## 2016-06-17 DIAGNOSIS — R40243 Glasgow coma scale score 3-8, unspecified time: Secondary | ICD-10-CM

## 2016-06-17 DIAGNOSIS — Z515 Encounter for palliative care: Secondary | ICD-10-CM | POA: Diagnosis present

## 2016-06-17 DIAGNOSIS — I110 Hypertensive heart disease with heart failure: Secondary | ICD-10-CM | POA: Diagnosis present

## 2016-06-17 DIAGNOSIS — R0681 Apnea, not elsewhere classified: Secondary | ICD-10-CM | POA: Diagnosis present

## 2016-06-17 DIAGNOSIS — N390 Urinary tract infection, site not specified: Secondary | ICD-10-CM | POA: Diagnosis not present

## 2016-06-17 DIAGNOSIS — Z79899 Other long term (current) drug therapy: Secondary | ICD-10-CM

## 2016-06-17 DIAGNOSIS — J449 Chronic obstructive pulmonary disease, unspecified: Secondary | ICD-10-CM | POA: Diagnosis present

## 2016-06-17 DIAGNOSIS — I509 Heart failure, unspecified: Secondary | ICD-10-CM | POA: Diagnosis present

## 2016-06-17 DIAGNOSIS — F0390 Unspecified dementia without behavioral disturbance: Principal | ICD-10-CM | POA: Diagnosis present

## 2016-06-17 DIAGNOSIS — K922 Gastrointestinal hemorrhage, unspecified: Secondary | ICD-10-CM | POA: Diagnosis present

## 2016-06-17 DIAGNOSIS — R4189 Other symptoms and signs involving cognitive functions and awareness: Secondary | ICD-10-CM | POA: Diagnosis present

## 2016-06-17 DIAGNOSIS — Z809 Family history of malignant neoplasm, unspecified: Secondary | ICD-10-CM | POA: Diagnosis not present

## 2016-06-17 DIAGNOSIS — Z9010 Acquired absence of unspecified breast and nipple: Secondary | ICD-10-CM

## 2016-06-17 DIAGNOSIS — Z888 Allergy status to other drugs, medicaments and biological substances status: Secondary | ICD-10-CM | POA: Diagnosis not present

## 2016-06-17 LAB — COMPREHENSIVE METABOLIC PANEL
ALK PHOS: 103 U/L (ref 38–126)
ALT: 11 U/L — AB (ref 14–54)
AST: 47 U/L — ABNORMAL HIGH (ref 15–41)
Albumin: 3.3 g/dL — ABNORMAL LOW (ref 3.5–5.0)
Anion gap: 6 (ref 5–15)
BILIRUBIN TOTAL: 1.2 mg/dL (ref 0.3–1.2)
BUN: 19 mg/dL (ref 6–20)
CALCIUM: 9.4 mg/dL (ref 8.9–10.3)
CO2: 28 mmol/L (ref 22–32)
CREATININE: 0.79 mg/dL (ref 0.44–1.00)
Chloride: 99 mmol/L — ABNORMAL LOW (ref 101–111)
Glucose, Bld: 107 mg/dL — ABNORMAL HIGH (ref 65–99)
Potassium: 4.7 mmol/L (ref 3.5–5.1)
SODIUM: 133 mmol/L — AB (ref 135–145)
Total Protein: 8.7 g/dL — ABNORMAL HIGH (ref 6.5–8.1)

## 2016-06-17 LAB — CBC WITH DIFFERENTIAL/PLATELET
Basophils Absolute: 0 10*3/uL (ref 0–0.1)
Basophils Relative: 1 %
Eosinophils Absolute: 0 10*3/uL (ref 0–0.7)
Eosinophils Relative: 1 %
HEMATOCRIT: 30.5 % — AB (ref 35.0–47.0)
HEMOGLOBIN: 10.1 g/dL — AB (ref 12.0–16.0)
LYMPHS ABS: 0.6 10*3/uL — AB (ref 1.0–3.6)
Lymphocytes Relative: 18 %
MCH: 28.1 pg (ref 26.0–34.0)
MCHC: 33 g/dL (ref 32.0–36.0)
MCV: 85.3 fL (ref 80.0–100.0)
Monocytes Absolute: 0.3 10*3/uL (ref 0.2–0.9)
Monocytes Relative: 8 %
NEUTROS ABS: 2.4 10*3/uL (ref 1.4–6.5)
NEUTROS PCT: 72 %
Platelets: 226 10*3/uL (ref 150–440)
RBC: 3.58 MIL/uL — AB (ref 3.80–5.20)
RDW: 15.9 % — ABNORMAL HIGH (ref 11.5–14.5)
WBC: 3.3 10*3/uL — AB (ref 3.6–11.0)

## 2016-06-17 LAB — URINALYSIS COMPLETE WITH MICROSCOPIC (ARMC ONLY)
BILIRUBIN URINE: NEGATIVE
GLUCOSE, UA: NEGATIVE mg/dL
Hgb urine dipstick: NEGATIVE
Ketones, ur: NEGATIVE mg/dL
NITRITE: POSITIVE — AB
Protein, ur: NEGATIVE mg/dL
Specific Gravity, Urine: 1.01 (ref 1.005–1.030)
pH: 6 (ref 5.0–8.0)

## 2016-06-17 MED ORDER — SODIUM CHLORIDE 0.9 % IV SOLN
Freq: Once | INTRAVENOUS | Status: AC
  Administered 2016-06-17: 18:00:00 via INTRAVENOUS

## 2016-06-17 MED ORDER — ONDANSETRON HCL 4 MG PO TABS: 4.0000 mg | ORAL_TABLET | Freq: Four times a day (QID) | ORAL | Status: DC | PRN

## 2016-06-17 MED ORDER — ONDANSETRON HCL 4 MG/2ML IJ SOLN: 4.0000 mg | Freq: Four times a day (QID) | INTRAMUSCULAR | Status: DC | PRN

## 2016-06-17 MED ORDER — MORPHINE 100MG IN NS 100ML (1MG/ML) PREMIX INFUSION
5.0000 mg/h | INTRAVENOUS | Status: DC
  Administered 2016-06-18: 5 mg/h via INTRAVENOUS
  Administered 2016-06-18: 10 mg/h via INTRAVENOUS
  Filled 2016-06-17 (×2): qty 100

## 2016-06-17 MED ORDER — LORAZEPAM 2 MG/ML IJ SOLN
5.0000 mg/h | INTRAVENOUS | Status: DC
  Filled 2016-06-17: qty 25

## 2016-06-17 MED ORDER — MORPHINE BOLUS VIA INFUSION
5.0000 mg | INTRAVENOUS | Status: DC | PRN
  Filled 2016-06-17: qty 20

## 2016-06-17 MED ORDER — DEXTROSE 5 % IV SOLN
1.0000 g | INTRAVENOUS | Status: AC
  Administered 2016-06-17: 1 g via INTRAVENOUS
  Filled 2016-06-17: qty 10

## 2016-06-17 MED ORDER — LORAZEPAM BOLUS VIA INFUSION
2.0000 mg | INTRAVENOUS | Status: DC | PRN
  Filled 2016-06-17: qty 5

## 2016-06-17 MED ORDER — FLEET ENEMA 7-19 GM/118ML RE ENEM: 1.0000 | ENEMA | Freq: Once | RECTAL | Status: DC | PRN

## 2016-06-17 NOTE — ED Provider Notes (Signed)
[facility] Emergency Department Provider Note  ____________________________________________  Time seen: Approximately 4:00 PM  I have reviewed the triage vital signs and the nursing notes.   HISTORY  Chief Complaint Altered Mental Status    HPI Haley Paul is a 82 y.o. female with a history of advanced dementia and multiple other chronic medical problems to lives at home with the 24-hour care by family members.  She presents by EMS today for evaluation of acute change in her mental status.  She had "a rough night" she was not sleeping well and seemed more altered than her normal dementia.  By this morning her son describes her as "unresponsive".  While she normally will speak to them, ambulates by herself, etc., she is not responsive to any external stimuli at this time.  She does have a DO NOT RESUSCITATE/DO NOT INTUBATE order in place.  She did not have a fall of which they are aware and has no external signs of trauma.  He was admitted to the hospital several weeks ago for a GI bleed and this is been a chronic and ongoing issue for her.  The family is not interested in aggressive treatment at this time but are concerned about the fact that she is completely unresponsive.     Past Medical History:  Diagnosis Date  . Atrial fibrillation ([place])   . Breast cancer ([place])   . Cancer ([place])   . CHF (congestive heart failure) ([place])   . COPD (chronic obstructive pulmonary disease) ([place])   . Dementia   . Hypertension     Patient Active Problem List   Diagnosis Date Noted  . Unresponsiveness 05/25/2016  . GIB (gastrointestinal bleeding) 05/31/2016  . GI bleed 05/30/2016  . Rectal bleeding 04/23/2016  . Anemia 04/23/2016  . COPD (chronic obstructive pulmonary disease) ([place]) 04/23/2016  . Breast cancer ([place]) 04/23/2016  . Atrial fibrillation ([place]) 04/23/2016  . Dementia 04/23/2016  . HTN (hypertension) 04/23/2016    Past Surgical History:    Procedure Laterality Date  . ABDOMINAL HYSTERECTOMY    . BACK SURGERY    . MASTECTOMY Left       Allergies Budesonide-formoterol fumarate; Donepezil; and Sulfa antibiotics  No family history on file.  Social History Social History  Substance Use Topics  . Smoking status: Former Research scientist (life sciences)  . Smokeless tobacco: Never Used  . Alcohol use No    Review of Systems Able to obtain review of systems due to the patient's unresponsiveness  ____________________________________________   PHYSICAL EXAM:  VITAL SIGNS: ED Triage Vitals [05/26/2016 1537]  Enc Vitals Group     BP (!) 156/67     Pulse Rate 89     Resp 12     Temp      Temp src      SpO2 97 %     Weight      Height      Head Circumference      Peak Flow      Pain Score      Pain Loc      Pain Edu?      Excl. in [place]?     Constitutional: Pale and elderly woman with open eyes but unresponsive to external stimuli Eyes: Conjunctivae are normal. PERRL. EOMI. corneal reflex intact Head: Atraumatic. Nose: No congestion/rhinnorhea. Mouth/Throat: Mucous membranes are dry.   Neck: No stridor.  No meningeal signs.  No cervical spine tenderness to palpation. Cardiovascular: Normal rate, regular rhythm. Good peripheral circulation.  Grossly normal heart sounds.   Respiratory: Normal respiratory effort.  No retractions. Lungs CTAB. Gastrointestinal: Soft and nontender. No distention.  Musculoskeletal: No lower extremity tenderness nor edema. No gross deformities of extremities. Neurologic:  The patient cannot or will not participate in a neurological exam.  She is not withdrawing from or localizing to painful stimuli.  She has a GCS of 6 (4 for eye opening spontaneously, otherwise 1 for verbal and 1 for motor) Skin:  Skin is warm, dry and intact. No rash noted.  Very pale.  ____________________________________________   LABS (all labs ordered are listed, but only abnormal results are displayed)  Labs Reviewed  URINALYSIS  COMPLETEWITH MICROSCOPIC ([facility] ONLY) - Abnormal; Notable for the following:       Result Value   Color, Urine YELLOW (*)    APPearance CLOUDY (*)    Nitrite POSITIVE (*)    Leukocytes, UA 3+ (*)    Bacteria, UA RARE (*)    Squamous Epithelial / LPF 6-30 (*)    All other components within normal limits  CBC WITH DIFFERENTIAL/PLATELET - Abnormal; Notable for the following:    WBC 3.3 (*)    RBC 3.58 (*)    Hemoglobin 10.1 (*)    HCT 30.5 (*)    RDW 15.9 (*)    Lymphs Abs 0.6 (*)    All other components within normal limits  COMPREHENSIVE METABOLIC PANEL - Abnormal; Notable for the following:    Sodium 133 (*)    Chloride 99 (*)    Glucose, Bld 107 (*)    Total Protein 8.7 (*)    Albumin 3.3 (*)    AST 47 (*)    ALT 11 (*)    All other components within normal limits  URINE CULTURE   ____________________________________________  EKG  ED ECG REPORT I, Najae Rathert, the attending physician, personally viewed and interpreted this ECG.  Date: 05/31/2016 EKG Time: 15:54 Rate: 73 Rhythm: atrial fibrillation QRS Axis: normal Intervals: normal ST/T Wave abnormalities: Non-specific ST segment / T-wave changes, but no evidence of acute ischemia. Conduction Disturbances: none Narrative Interpretation: No evidence of acute ischemia  ____________________________________________  RADIOLOGY   Ct Head Wo Contrast  Result Date: 06/10/2016 CLINICAL DATA:  History of dementia with increasing unresponsiveness EXAM: CT HEAD WITHOUT CONTRAST TECHNIQUE: Contiguous axial images were obtained from the base of the skull through the vertex without intravenous contrast. COMPARISON:  11/21/2013 FINDINGS: Bony calvarium is intact. Diffuse atrophic changes are noted. There are changes consistent with an evolving left occipital infarct in the distribution of the left posterior cerebral artery. Causes some mild mass effect upon the left lateral ventricle. No definitive associated hemorrhage is  seen. No other areas of acute ischemia are noted. No space-occupying mass lesion is noted. Chronic white matter ischemic change is also seen. IMPRESSION: Chronic atrophic and ischemic changes. 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 Electronically Signed   By: Inez Catalina M.D.   On: 05/29/2016 17:36  Dg Chest Portable 1 View  Result Date: 05/26/2016 CLINICAL DATA:  Decreased responsiveness since this morning; history of COPD, CHF, atrial fibrillation, dementia, and breast malignancy EXAM: PORTABLE CHEST 1 VIEW COMPARISON:  PA and lateral chest x-ray of February 14, 2016 FINDINGS: The patient is rotated on this study. The lungs are well-expanded. The lung markings are coarse in the lateral retrocardiac region. Previously demonstrated pneumonia in the right upper lobe has cleared. The cardiac silhouette remains enlarged. The pulmonary vascularity is not clearly engorged. The  bones are mildly osteopenic. There is calcification in the wall of the aortic arch. IMPRESSION: No definite acute cardiopulmonary abnormality. There is mildly increase retrocardiac density laterally which could reflect aspiration or atelectasis. The study is limited due to rotation however. A well-positioned PA and lateral chest x-ray would be useful if the patient can undergo the procedure. Cardiomegaly without pulmonary vascular congestion. Aortic atherosclerosis. Electronically Signed   By: David  Martinique M.D.   On: 05/26/2016 16:54   ____________________________________________   PROCEDURES  Procedure(s) performed:   Procedures   ____________________________________________   INITIAL IMPRESSION / ASSESSMENT AND PLAN / ED COURSE  Pertinent labs & imaging results that were available during my care of the patient were reviewed by me and considered in my medical decision making (see chart for details).  The agents family are clear that they do not want aggressive treatment.  If she was not DNR/DNI  would likely intubate now based on her very low GCS.  However I will proceed with a medical workup to identify possible sources of acute delirium such as infection, intracranial pathology, etc.  Clinical Course  Value Comment By Time  WBC, UA: TOO NUMEROUS TO COUNT (Reviewed) Hinda Kehr, MD 07/25 1708  WBC, UA: TOO NUMEROUS TO COUNT The patient has a very clear and obvious urinary tract infection.  This may be causing or at least contributing to her significant altered mental status.  As this is a treatable condition, I will give ceftriaxone 1 g IV and start a normal saline infusion.  I am awaiting the results of her metabolic panel and imaging. Hinda Kehr, MD 07/25 [phone]    (Note that documentation was delayed due to multiple ED patients requiring immediate care.) In addition to the UTI, the patient has what appears to be a subacute CVA on her noncontrast head CT.  This may explain her recently increased agitation, and now her unresponsiveness.  I updated the family and they agree with the plan for admission with the plan for comfort care.  ____________________________________________  FINAL CLINICAL IMPRESSION(S) / ED DIAGNOSES  Final diagnoses:  Delirium  Glasgow coma scale total score 3-8, unspecified time ([place])  UTI (lower urinary tract infection)  Cerebral infarction due to unspecified occlusion or stenosis of left posterior cerebral artery (H[facility])     MEDICATIONS GIVEN DURING THIS VISIT:  Medications  LORazepam (ATIVAN) bolus via infusion 2-5 mg (not administered)  LORazepam (ATIVAN) 50 mg in dextrose 5 % 50 mL (1 mg/mL) infusion (not administered)  morphine bolus via infusion 5-20 mg (not administered)  morphine 100mg  in NS 181mL (1mg /mL) infusion - premix (not administered)  ondansetron (ZOFRAN) tablet 4 mg (not administered)    Or  ondansetron (ZOFRAN) injection 4 mg (not administered)  sodium phosphate (FLEET) 7-19 GM/118ML enema 1 enema (not administered)  cefTRIAXone  (ROCEPHIN) 1 g in dextrose 5 % 50 mL IVPB (0 g Intravenous Stopped 05/28/2016 1817)  0.9 %  sodium chloride infusion ( Intravenous Stopped 05/28/2016 1902)     NEW OUTPATIENT MEDICATIONS STARTED DURING THIS VISIT:  Current Discharge Medication List        Note:  This document was prepared using Dragon voice recognition software and may include unintentional dictation errors.    Hinda Kehr, MD 2016/07/17 0010

## 2016-06-17 NOTE — H&P (Signed)
[facility] [place] NAME: Haley Paul    MR#:  [id number]  DATE OF BIRTH:  May 01, 1920  DATE OF ADMISSION:  05/27/2016  PRIMARY CARE PHYSICIAN: Madelyn Brunner, MD   REQUESTING/REFERRING PHYSICIAN: Hinda Kehr, MD  CHIEF COMPLAINT:   Chief Complaint  Patient presents with  . Altered Mental Status    HISTORY OF PRESENT ILLNESS:  Haley Paul  is a 82 y.o. female with a known history of advanced dementia and multiple other chronic medical problems who lives at home with the 24-hour care by family members.  She presents by EMS today for evaluation of acute change in her mental status.  She had "a rough night" she was not sleeping well .  She received 50 mg of trazodone and melatonin also. By this morning her son describes her as "unresponsive".  While she normally will speak to them, ambulates by herself, etc., she is not responsive to any external stimuli at this time.  She does have a DO NOT RESUSCITATE/DO NOT INTUBATE order in place.  She did not have a fall of which they are aware and has no external signs of trauma. She was recently admitted to the hospital 2-3 weeks ago for a GI bleed and was D/Ced after some blood transfustion - mainly conservative mgmt.  The family is not interested in aggressive treatment at this time and requesting comfort care and hospice services. PAST MEDICAL HISTORY:   Past Medical History:  Diagnosis Date  . Atrial fibrillation ([place])   . Breast cancer ([place])   . Cancer ([place])   . CHF (congestive heart failure) ([place])   . COPD (chronic obstructive pulmonary disease) ([place])   . Dementia   . Hypertension     PAST SURGICAL HISTORY:   Past Surgical History:  Procedure Laterality Date  . ABDOMINAL HYSTERECTOMY    . BACK SURGERY    . MASTECTOMY Left     SOCIAL HISTORY:   Social History  Substance Use Topics  . Smoking status: Former Research scientist (life sciences)  . Smokeless tobacco: Never Used  . Alcohol use No     FAMILY HISTORY:  No family history on file. . Dementia Mother  . Alzheimer's disease Mother  . Cancer Father  abdominal  . Hearing loss Son  . Hearing loss Son DRUG ALLERGIES:   Allergies  Allergen Reactions  . Budesonide-Formoterol Fumarate Other (See Comments)  . Donepezil Other (See Comments)  . Sulfa Antibiotics Other (See Comments)    REVIEW OF SYSTEMS:   Review of Systems  Unable to perform ROS: Mental acuity   MEDICATIONS AT HOME:   Prior to Admission medications   Medication Sig Start Date End Date Taking? Authorizing Provider  diltiazem (DILACOR XR) 120 MG 24 hr capsule Take 1 capsule by mouth at bedtime.  07/31/15  Yes Historical Provider, MD  docusate sodium (COLACE) 100 MG capsule Take 100 mg by mouth 2 (two) times daily as needed.    Yes Historical Provider, MD  furosemide (LASIX) 20 MG tablet Take 1 tablet (20 mg total) by mouth every other day. 06/01/16  Yes Sital Mody, MD  levothyroxine (SYNTHROID, LEVOTHROID) 50 MCG tablet Take 1 tablet by mouth at bedtime.  07/31/15  Yes Historical Provider, MD  Melatonin 3 MG TABS Take 3 mg by mouth at bedtime.   Yes Historical Provider, MD  metoprolol tartrate (LOPRESSOR) 25 MG tablet Take 0.5 tablets (12.5 mg total) by mouth 2 (two) times daily. 04/24/16  Yes Fritzi Mandes, MD  risperiDONE (RISPERDAL) 0.5 MG tablet Take 1 tablet by mouth 2 (two) times daily.  01/14/16 07/12/16 Yes Historical Provider, MD  traZODone (DESYREL) 50 MG tablet Take 50 mg by mouth at bedtime.   Yes Historical Provider, MD      VITAL SIGNS:  Blood pressure (!) 174/76, pulse 76, temperature (!) 96.6 F (35.9 C), temperature source Rectal, resp. rate (!) 21, weight 57.7 kg (127 lb 1.6 oz), SpO2 99 %.  PHYSICAL EXAMINATION:  Physical Exam  Constitutional: She appears lethargic, malnourished and dehydrated. She appears unhealthy. She appears cachectic. She has a sickly appearance.  HENT:  Head: Normocephalic and atraumatic.  Eyes: Conjunctivae and EOM  are normal. Pupils are equal, round, and reactive to light.  Neck: Normal range of motion. Neck supple. No tracheal deviation present. No thyromegaly present.  Cardiovascular: Normal rate, regular rhythm and normal heart sounds.   Pulmonary/Chest: No respiratory distress. She has decreased breath sounds in the right lower field and the left lower field. She has no wheezes. She exhibits no tenderness.  Abdominal: Soft. Bowel sounds are normal. She exhibits no distension. There is no tenderness.  Musculoskeletal: Normal range of motion.  Neurological: She appears lethargic. No cranial nerve deficit.  She is completely unresponsive, so unable to evaluate  Skin: Skin is warm and dry. No rash noted.  Psychiatric:  She is completely unresponsive, so unable to evaluate  Nursing note and vitals reviewed.  LABORATORY PANEL:   CBC  Recent Labs Lab 06/13/2016 1648  WBC 3.3*  HGB 10.1*  HCT 30.5*  PLT 226   ------------------------------------------------------------------------------------------------------------------  Chemistries   Recent Labs Lab 06/16/2016 1648  NA 133*  K 4.7  CL 99*  CO2 28  GLUCOSE 107*  BUN 19  CREATININE 0.79  CALCIUM 9.4  AST 47*  ALT 11*  ALKPHOS 103  BILITOT 1.2   ------------------------------------------------------------------------------------------------------------------  Cardiac Enzymes No results for input(s): TROPONINI in the last 168 hours. ------------------------------------------------------------------------------------------------------------------  RADIOLOGY:  Ct Head Wo Contrast  Result Date: 05/29/2016 CLINICAL DATA:  History of dementia with increasing unresponsiveness EXAM: CT HEAD WITHOUT CONTRAST TECHNIQUE: Contiguous axial images were obtained from the base of the skull through the vertex without intravenous contrast. COMPARISON:  11/21/2013 FINDINGS: Bony calvarium is intact. Diffuse atrophic changes are noted. There are  changes consistent with an evolving left occipital infarct in the distribution of the left posterior cerebral artery. Causes some mild mass effect upon the left lateral ventricle. No definitive associated hemorrhage is seen. No other areas of acute ischemia are noted. No space-occupying mass lesion is noted. Chronic white matter ischemic change is also seen. IMPRESSION: Chronic atrophic and ischemic changes. 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 Electronically Signed   By: Inez Catalina M.D.   On: 06/16/2016 17:36  Dg Chest Portable 1 View  Result Date: 06/15/2016 CLINICAL DATA:  Decreased responsiveness since this morning; history of COPD, CHF, atrial fibrillation, dementia, and breast malignancy EXAM: PORTABLE CHEST 1 VIEW COMPARISON:  PA and lateral chest x-ray of February 14, 2016 FINDINGS: The patient is rotated on this study. The lungs are well-expanded. The lung markings are coarse in the lateral retrocardiac region. Previously demonstrated pneumonia in the right upper lobe has cleared. The cardiac silhouette remains enlarged. The pulmonary vascularity is not clearly engorged. The bones are mildly osteopenic. There is calcification in the wall of the aortic arch. IMPRESSION: No definite acute cardiopulmonary abnormality. There is mildly increase retrocardiac density laterally which could reflect  aspiration or atelectasis. The study is limited due to rotation however. A well-positioned PA and lateral chest x-ray would be useful if the patient can undergo the procedure. Cardiomegaly without pulmonary vascular congestion. Aortic atherosclerosis. Electronically Signed   By: David  Martinique M.D.   On: 06/07/2016 16:54  IMPRESSION AND PLAN:  81 y.o. female with a known history of advanced dementia and multiple other chronic medical problems who lives at home with the 24-hour care by family members is being admitted for unresponsiveness  * Unresponsiveness - Could be from trazodone  and/or melatonin - Family does not want any aggressive care and chose comfort care and hospice - Consult palliative care and care management to evaluate for hospice and possible hospice home discharge If beds tomorrow - We will provide morphine and Ativan for comfort care  .  History of GI bleed - Family agreeable for comfort care and hospice   * Atrial fibrillation - Family agreeable for comfort care and hospice   * History of breast cancer - Family agreeable for comfort care and hospice   COPD  - Comfort care  Hypertension - comfort care  Severe dementia - comfort care   All the records are reviewed and case discussed with ED provider. Management plans discussed with the patient, family (son and daughter in law) and they are in agreement.  CODE STATUS: DO NOT RESUSCITATE - Nurse may pronounce, Comfort care - Hospice  TOTAL TIME TAKING CARE OF THIS PATIENT: 45 minutes.    [facility], Marge Vandermeulen M.D on 05/24/2016 at 6:41 PM  Between 7am to 6pm - Pager - [phone]  After 6pm go to [email or web address] - Proofreader  [facility] [place] [facility]  Office  [phone]  CC: Primary care physician; Madelyn Brunner, MD   Note: This dictation was prepared with Dragon dictation along with smaller phrase technology. Any transcriptional errors that result from this process are unintentional.

## 2016-06-17 NOTE — ED Triage Notes (Signed)
Per EMS pt from home. Family reports pt has dementia but today is different from baseline. Not responding to commands and altered from normal.  They are concerned that the pt may be constipated.  Hx of HTN, CHF, Dementia.  Afib on EMS monitor.

## 2016-06-17 NOTE — ED Notes (Signed)
Per family at bedside pt has last had a "normal" bowel movement on Sunday 7/16.  The only other stool produced was a scant amount a week after that on Sunday, 7/23.  Have tried Miralax at home.  Per family pt has not eaten well lately and has eaten nothing since yesterday.  Only drank Miralax since yesterday.  Last night was able to ambulate with walker to the bathroom and today is completely dependent on others for movement and not responding at all.  Constant gaze to the left.  History of afib. Lung sounds clear.

## 2016-06-18 DIAGNOSIS — I63532 Cerebral infarction due to unspecified occlusion or stenosis of left posterior cerebral artery: Secondary | ICD-10-CM

## 2016-06-18 DIAGNOSIS — Z66 Do not resuscitate: Secondary | ICD-10-CM

## 2016-06-18 DIAGNOSIS — R404 Transient alteration of awareness: Secondary | ICD-10-CM

## 2016-06-18 DIAGNOSIS — N390 Urinary tract infection, site not specified: Secondary | ICD-10-CM

## 2016-06-18 DIAGNOSIS — Z515 Encounter for palliative care: Secondary | ICD-10-CM

## 2016-06-18 MED ORDER — LORAZEPAM 2 MG/ML IJ SOLN: 1.0000 mg | INTRAMUSCULAR | Status: DC | PRN

## 2016-06-18 MED ORDER — BISACODYL 10 MG RE SUPP
10.0000 mg | Freq: Every day | RECTAL | Status: DC
  Administered 2016-06-18: 10 mg via RECTAL
  Filled 2016-06-18: qty 1

## 2016-06-20 LAB — URINE CULTURE: Special Requests: NORMAL

## 2016-06-24 NOTE — Progress Notes (Signed)
Nutrition Brief Note  Pt assessed for Low [place]. Chart reviewed. Pt now transitioning to comfort care.  Pt remains NPO at this time. No further nutrition interventions warranted at this time.  Please re-consult as needed.   Dwyane Luo, RD, LDN Pager [phone] Weekend/On-Call Pager [phone]

## 2016-06-24 NOTE — Consult Note (Signed)
Consultation Note Date: 2016/07/09   Patient Name: Haley Paul  DOB: [birth year]  MRN: [id number]  Age / Sex: 82 y.o., female  PCP: Madelyn Brunner, MD Referring Physician: Max Sane, MD  Reason for Consultation: Establishing goals of care, Non pain symptom management, Pain control and Psychosocial/spiritual support  HPI/Patient Profile: 82 y.o. female  admitted on 06/06/2016 with  known history of advanced dementia and multiple other chronic medical problems who lives at home with the 24-hour care by family members. Admitted  for evaluation of acute change in her mental status. She had "a rough night" she was not sleeping well .  She received 50 mg of trazodone and melatonin also.By this morning her son describes her as "unresponsive". While she normally will speak to them, ambulates by herself, etc., she is not responsive to any external stimuli at this time.  She did not have a fall of which they are aware and has no external signs of trauma. She was recently admitted to the hospital 2-3 weeks ago for a GI bleed and was D/Ced after some blood transfustion - mainly conservative mgmt.  Head CT 05/25/2016 IMPRESSION: Chronic atrophic and ischemic changes. 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 Electronically Signed   Clinical Assessment and Goals of Care:  This NP Wadie Lessen reviewed medical records, received report from team, assessed the patient and then meet at the patient's bedside along with her son/Joe and his wife/Ann  to discuss diagnosis, prognosis, GOC, EOL wishes disposition and options.  A detailed discussion was had today regarding advanced directives.  Concepts specific to code status, artifical feeding and hydration, continued IV antibiotics and rehospitalization was had.  The difference between a aggressive medical intervention path  and a palliative comfort  care path for this patient at this time was had.  Values and goals of care important to patient and family were attempted to be elicited.  Concepts of Hospice and Palliative Care were discussed.  We discussed the natural trajectory of dementia, concept of failure to thrive and mortality.  Natural trajectory and expectations at EOL were discussed.  Questions and concerns addressed.  Family encouraged to call with questions or concerns.  PMT will continue to support holistically.    SUMMARY OF RECOMMENDATIONS    Code Status/Advance Care Planning:  DNR  Symptom Management:   Pain/Dyspnea: Morphine gtt, now at 5 mg/hr and titrate for comfort   Agitation: Ativan 1 mg IV every 4 hrs prn  Palliative Prophylaxis:   Bowel Regimen, Frequent Pain Assessment and Oral Care  Additional Recommendations (Limitations, Scope, Preferences):  Full Comfort Care  Psycho-social/Spiritual:   Desire for further Chaplaincy support:yes  Additional Recommendations: Education on Hospice  Prognosis:   Hours - Days  Discharge Planning:    Long periods of apnea, no po intake, no artificial feeding or hydration.  Anticipated Hospital Death     Primary Diagnoses: Present on Admission: . Unresponsiveness   I have reviewed the medical record, interviewed the patient and family, and examined the  patient. The following aspects are pertinent.  Past Medical History:  Diagnosis Date  . Atrial fibrillation ([place])   . Breast cancer ([place])   . Cancer ([place])   . CHF (congestive heart failure) ([place])   . COPD (chronic obstructive pulmonary disease) ([place])   . Dementia   . Hypertension    Social History   Social History  . Marital status: Widowed    Spouse name: N/A  . Number of children: N/A  . Years of education: N/A   Social History Main Topics  . Smoking status: Former Research scientist (life sciences)  . Smokeless tobacco: Never Used  . Alcohol use No  . Drug use: Unknown  . Sexual activity: Not on file   Other  Topics Concern  . Not on file   Social History Narrative  . No narrative on file   No family history on file. Scheduled Meds:  Continuous Infusions: . morphine 10 mg/hr (2016/06/27 0026)   PRN Meds:.LORazepam, morphine, ondansetron **OR** ondansetron (ZOFRAN) IV, sodium phosphate Medications Prior to Admission:  Prior to Admission medications   Medication Sig Start Date End Date Taking? Authorizing Provider  diltiazem (DILACOR XR) 120 MG 24 hr capsule Take 1 capsule by mouth at bedtime.  07/31/15  Yes Historical Provider, MD  docusate sodium (COLACE) 100 MG capsule Take 100 mg by mouth 2 (two) times daily as needed.    Yes Historical Provider, MD  furosemide (LASIX) 20 MG tablet Take 1 tablet (20 mg total) by mouth every other day. 06/01/16  Yes Sital Mody, MD  levothyroxine (SYNTHROID, LEVOTHROID) 50 MCG tablet Take 1 tablet by mouth at bedtime.  07/31/15  Yes Historical Provider, MD  Melatonin 3 MG TABS Take 3 mg by mouth at bedtime.   Yes Historical Provider, MD  metoprolol tartrate (LOPRESSOR) 25 MG tablet Take 0.5 tablets (12.5 mg total) by mouth 2 (two) times daily. 04/24/16  Yes Fritzi Mandes, MD  risperiDONE (RISPERDAL) 0.5 MG tablet Take 1 tablet by mouth 2 (two) times daily.  01/14/16 07/12/16 Yes Historical Provider, MD  traZODone (DESYREL) 50 MG tablet Take 50 mg by mouth at bedtime.   Yes Historical Provider, MD   Allergies  Allergen Reactions  . Budesonide-Formoterol Fumarate Other (See Comments)  . Donepezil Other (See Comments)  . Sulfa Antibiotics Other (See Comments)   Review of Systems  Unable to perform ROS: Acuity of condition    Physical Exam  Constitutional: She appears cachectic.  HENT:  Mouth/Throat: Mucous membranes are dry.  Cardiovascular: Normal rate, regular rhythm and normal heart sounds.   Pulmonary/Chest:  - apnea, 45 second pauses  Neurological: She is unresponsive.    Vital Signs: BP (!) 156/69   Pulse 79   Temp (!) 96.6 F (35.9 C) (Rectal)   Resp  (!) 21   Wt 57.7 kg (127 lb 1.6 oz)   SpO2 99%   BMI 19.91 kg/m  Pain Assessment: PAINAD     SpO2: SpO2: 99 % O2 Device:SpO2: 99 % O2 Flow Rate: .   IO: Intake/output summary: No intake or output data in the 24 hours ending 27-Jun-2016 0818  LBM:   Baseline Weight: Weight: 57.7 kg (127 lb 1.6 oz) Most recent weight: Weight: 57.7 kg (127 lb 1.6 oz)     Palliative Assessment/Data:  10%   Discussed with Dr Manuella Ghazi  Time In: 0715 Time Out: 0830 Time Total: 75 min Greater than 50%  of this time was spent counseling and coordinating care related to the above assessment and plan.  Signed by: Wadie Lessen, NP   Please contact Palliative Medicine Team phone at [phone] for questions and concerns.  For individual provider: See Shea Evans

## 2016-06-24 NOTE — Progress Notes (Signed)
[name] [place] NAME: Haley Paul    MR#:  [id number]  DATE OF BIRTH:  1920-09-30  SUBJECTIVE:  CHIEF COMPLAINT:   Chief Complaint  Patient presents with  . Altered Mental Status  Still unresponsive. REVIEW OF SYSTEMS:  Review of Systems  Unable to perform ROS: Mental acuity   DRUG ALLERGIES:   Allergies  Allergen Reactions  . Budesonide-Formoterol Fumarate Other (See Comments)  . Donepezil Other (See Comments)  . Sulfa Antibiotics Other (See Comments)   VITALS:  Blood pressure (!) 156/69, pulse 79, temperature (!) 96.6 F (35.9 C), temperature source Rectal, resp. rate (!) 21, weight 57.7 kg (127 lb 1.6 oz), SpO2 99 %. PHYSICAL EXAMINATION:  Physical Exam  Constitutional: She appears malnourished. She appears cachectic.  HENT:  Head: Normocephalic and atraumatic.  Eyes: Conjunctivae and EOM are normal. Pupils are equal, round, and reactive to light.  Neck: Normal range of motion. Neck supple. No tracheal deviation present. No thyromegaly present.  Cardiovascular: Normal rate, regular rhythm and normal heart sounds.   Pulmonary/Chest: Effort normal and breath sounds normal. No respiratory distress. She has no wheezes. She exhibits no tenderness.  Abdominal: Soft. Bowel sounds are normal. She exhibits no distension. There is no tenderness.  Musculoskeletal: Normal range of motion.  Neurological: No cranial nerve deficit.  She is unresponsive  Skin: Skin is warm and dry. No rash noted.  Psychiatric:  She is unresponsive, so unable to evaluate  Nursing note and vitals reviewed.  LABORATORY PANEL:   CBC  Recent Labs Lab 06/16/2016 1648  WBC 3.3*  HGB 10.1*  HCT 30.5*  PLT 226   ------------------------------------------------------------------------------------------------------------------ Chemistries   Recent Labs Lab 06/06/2016 1648  NA 133*  K 4.7  CL 99*  CO2 28  GLUCOSE 107*  BUN 19  CREATININE  0.79  CALCIUM 9.4  AST 47*  ALT 11*  ALKPHOS 103  BILITOT 1.2   RADIOLOGY:  Ct Head Wo Contrast  Result Date: 06/16/2016 CLINICAL DATA:  History of dementia with increasing unresponsiveness EXAM: CT HEAD WITHOUT CONTRAST TECHNIQUE: Contiguous axial images were obtained from the base of the skull through the vertex without intravenous contrast. COMPARISON:  11/21/2013 FINDINGS: Bony calvarium is intact. Diffuse atrophic changes are noted. There are changes consistent with an evolving left occipital infarct in the distribution of the left posterior cerebral artery. Causes some mild mass effect upon the left lateral ventricle. No definitive associated hemorrhage is seen. No other areas of acute ischemia are noted. No space-occupying mass lesion is noted. Chronic white matter ischemic change is also seen. IMPRESSION: Chronic atrophic and ischemic changes. 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 Electronically Signed   By: Inez Catalina M.D.   On: 06/09/2016 17:36  Dg Chest Portable 1 View  Result Date: 06/22/2016 CLINICAL DATA:  Decreased responsiveness since this morning; history of COPD, CHF, atrial fibrillation, dementia, and breast malignancy EXAM: PORTABLE CHEST 1 VIEW COMPARISON:  PA and lateral chest x-ray of February 14, 2016 FINDINGS: The patient is rotated on this study. The lungs are well-expanded. The lung markings are coarse in the lateral retrocardiac region. Previously demonstrated pneumonia in the right upper lobe has cleared. The cardiac silhouette remains enlarged. The pulmonary vascularity is not clearly engorged. The bones are mildly osteopenic. There is calcification in the wall of the aortic arch. IMPRESSION: No definite acute cardiopulmonary abnormality. There is mildly increase retrocardiac density laterally which could reflect aspiration or atelectasis. The study  is limited due to rotation however. A well-positioned PA and lateral chest x-ray would be useful if  the patient can undergo the procedure. Cardiomegaly without pulmonary vascular congestion. Aortic atherosclerosis. Electronically Signed   By: David  Martinique M.D.   On: 06/06/2016 16:54  ASSESSMENT AND PLAN:  82 y.o. female with a known history of advanced dementia and multiple other chronic medical problems who lives at home with the 24-hour care by family members is being admitted for unresponsiveness  * Unresponsiveness - Continue morphine and Ativan for comfort care - will watch her another 24 hrs before transfer to [place] - per my d/w [facility] (Palliative care)  . History of GI bleed - Family agreeable for comfort care and hospice   * Atrial fibrillation - Family agreeable for comfort care and hospice   * History of breast cancer - Family agreeable for comfort care and hospice   COPD - Comfort care  Hypertension - comfort care  Severe dementia - comfort care     All the records are reviewed and case discussed with Care Management/Social Worker. Management plans discussed with the patient, family (at bedside) and they are in agreement.  CODE STATUS: DO NOT RESUSCITATE - Nurse may pronounce, Comfort care - Hospice  TOTAL TIME TAKING CARE OF THIS PATIENT: 15 minutes.   More than 50% of the time was spent in counseling/coordination of care: YES  POSSIBLE D/C IN 1 DAYS, DEPENDING ON CLINICAL CONDITION.   [facility], Nyazia Canevari M.D on 2016/07/10 at 9:15 AM  Between 7am to 6pm - Pager - [phone]  After 6pm go to [email or web address] - Proofreader  [facility] [place] [facility]  Office  [phone]  CC: Primary care physician; Madelyn Brunner, MD  Note: This dictation was prepared with Dragon dictation along with smaller phrase technology. Any transcriptional errors that result from this process are unintentional.

## 2016-06-24 NOTE — Clinical Social Work Note (Signed)
Pt expired today at 1240 prior to assessment. CSW is signing off as no further needs identified.   Darden Dates, MSW, LCSW  Clinical Social Worker  [phone]

## 2016-06-24 NOTE — Progress Notes (Signed)
Pt's time of death 55.  Pronounce by Clarise Cruz, RN and Jacky Kindle, RN.  Dr. Manuella Ghazi made aware as well as pt's daughter in law Josepha Tousey.

## 2016-06-24 NOTE — Discharge Summary (Addendum)
[employer] [facility] - [place] at [facility]    Death Note     Death Note please see Last Note for all details.   In breif - 82 y.o. femalewith a known history of advanced dementia and multiple other chronic medical problems wholives at home with the 24-hour care by family members admitted for unresponsiveness. Family chose comfort care as she seem to be actively dying.    Haley Paul [id number] is a 82 y.o. female, Outpatient Primary MD for the patient is Madelyn Brunner, MD  Pronounced dead by nurse on 03-Jul-2023  @ 1240.  Cause of death Definite one cause of death is undeterminable but most likely etiology as below  CHF COPD BREAST cancer UTI DEMENTIA Cerebral infarction due to unspecified occlusion or stenosis of left posterior cerebral artery   [facility], Janika Jedlicka M.D on 06/20/2016 at 3:22 PM  [facility] [place]  Total clinical and documentation time for today Under 30 minutes   Last Note  82 y.o. femalewith a known history of advanced dementia and multiple other chronic medical problems wholives at home with the 24-hour care by family members is being admitted for unresponsiveness  * Unresponsiveness - Continue morphine and Ativan for comfort care - will watch her another 24 hrs before transfer to [place] - per my d/w [facility] (Palliative care)  .History of GI bleed - Family agreeable forcomfort care and hospice   * Atrial fibrillation - Family agreeable forcomfort care and hospice   * History of breast cancer - Family agreeable forcomfort care and hospice   COPD - Comfort care  Hypertension - comfort care  Severe dementia - comfort care

## 2016-06-24 NOTE — Care Management Important Message (Signed)
Important Message  Patient Details  Name: RITAMAE HYDER MRN: [id number] Date of Birth: [birth year]   Medicare Important Message Given:  Yes    Shelbie Ammons, RN 10-Jul-2016, 11:35 AM

## 2016-06-24 NOTE — Care Management (Signed)
Admitted to [facility] with the diagnosis of unresponsiveness. Lives with family. Daughter is Lelon Frohlich. Son is Aleathea Villari [phone]). Palliative Care consult in progress. Comfort Care measures in place. Shelbie Ammons RN MSN CCM Care Management [phone]

## 2016-06-24 DEATH — deceased

## 2017-10-23 IMAGING — NM NM GI BLOOD LOSS
2 series · 12 of 12 positions shown · non-contrast
Comparison: None.

CLINICAL DATA: Bloody stools.

EXAM:
NUCLEAR MEDICINE GASTROINTESTINAL BLEEDING SCAN
TECHNIQUE: Sequential abdominal images were obtained following intravenous
administration of 4c-PPm labeled red blood cells.
RADIOPHARMACEUTICALS:  21.345 mCi 4c-PPm in-vitro labeled red cells.

[Series 1000: gi bleed · 4.80mm/px · 6 of 60 frames shown (1 of 2)]
[frame 6/60]
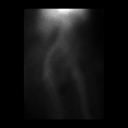
[frame 16/60]
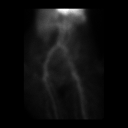
[frame 26/60]
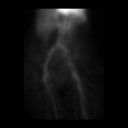
[frame 36/60]
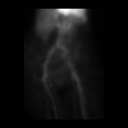
[frame 46/60]
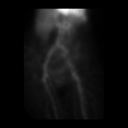
[frame 56/60]
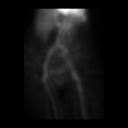

[Series 1000: gi bleed · 4.80mm/px · 6 of 60 frames shown (2 of 2)]
[frame 6/60]
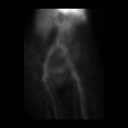
[frame 16/60]
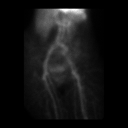
[frame 26/60]
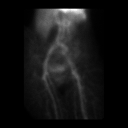
[frame 36/60]
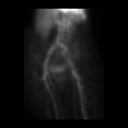
[frame 46/60]
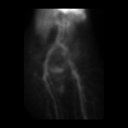
[frame 56/60]
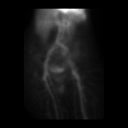

[12 of 12 positions shown; findings below may reference images not displayed]

FINDINGS: No source of the patient's GI bleed is identified.
IMPRESSION: Negative GI bleed.
